# Patient Record
Sex: Female | Born: 1955 | Race: White | Hispanic: No | Marital: Married | Smoking: Never smoker
Health system: Southern US, Community
[De-identification: ages and names within clinical notes are randomized; demographics above are authoritative.]

## PROBLEM LIST (undated history)

## (undated) DIAGNOSIS — D6942 Congenital and hereditary thrombocytopenia purpura: Secondary | ICD-10-CM

## (undated) DIAGNOSIS — T7840XA Allergy, unspecified, initial encounter: Secondary | ICD-10-CM

## (undated) DIAGNOSIS — Z860101 Personal history of adenomatous and serrated colon polyps: Secondary | ICD-10-CM

## (undated) DIAGNOSIS — M270 Developmental disorders of jaws: Secondary | ICD-10-CM

## (undated) DIAGNOSIS — Z86010 Personal history of colonic polyps: Secondary | ICD-10-CM

## (undated) HISTORY — DX: Developmental disorders of jaws: M27.0

## (undated) HISTORY — DX: Congenital and hereditary thrombocytopenia purpura: D69.42

## (undated) HISTORY — DX: Personal history of adenomatous and serrated colon polyps: Z86.0101

## (undated) HISTORY — PX: ABDOMINAL HYSTERECTOMY: SHX81

## (undated) HISTORY — DX: Allergy, unspecified, initial encounter: T78.40XA

## (undated) HISTORY — DX: Personal history of colonic polyps: Z86.010

---

## 1998-09-17 HISTORY — PX: COMBINED HYSTERECTOMY ABDOMINAL W/ MMK / BURCH PROCEDURE: SUR293

## 1999-06-28 ENCOUNTER — Encounter (INDEPENDENT_AMBULATORY_CARE_PROVIDER_SITE_OTHER): Payer: Self-pay | Admitting: Specialist

## 1999-06-28 ENCOUNTER — Inpatient Hospital Stay (HOSPITAL_COMMUNITY): Admission: RE | Admit: 1999-06-28 | Discharge: 1999-06-30 | Payer: Self-pay | Admitting: *Deleted

## 2000-02-27 ENCOUNTER — Encounter: Payer: Self-pay | Admitting: *Deleted

## 2000-02-27 ENCOUNTER — Encounter: Admission: RE | Admit: 2000-02-27 | Discharge: 2000-02-27 | Payer: Self-pay | Admitting: *Deleted

## 2001-02-27 ENCOUNTER — Encounter: Admission: RE | Admit: 2001-02-27 | Discharge: 2001-02-27 | Payer: Self-pay | Admitting: *Deleted

## 2001-02-27 ENCOUNTER — Encounter: Payer: Self-pay | Admitting: *Deleted

## 2001-04-01 ENCOUNTER — Other Ambulatory Visit: Admission: RE | Admit: 2001-04-01 | Discharge: 2001-04-01 | Payer: Self-pay | Admitting: *Deleted

## 2002-04-20 ENCOUNTER — Other Ambulatory Visit: Admission: RE | Admit: 2002-04-20 | Discharge: 2002-04-20 | Payer: Self-pay | Admitting: *Deleted

## 2002-04-27 ENCOUNTER — Encounter: Admission: RE | Admit: 2002-04-27 | Discharge: 2002-04-27 | Payer: Self-pay | Admitting: *Deleted

## 2002-04-27 ENCOUNTER — Encounter: Payer: Self-pay | Admitting: *Deleted

## 2003-05-14 ENCOUNTER — Encounter: Payer: Self-pay | Admitting: *Deleted

## 2003-05-14 ENCOUNTER — Encounter: Admission: RE | Admit: 2003-05-14 | Discharge: 2003-05-14 | Payer: Self-pay | Admitting: *Deleted

## 2003-06-07 ENCOUNTER — Other Ambulatory Visit: Admission: RE | Admit: 2003-06-07 | Discharge: 2003-06-07 | Payer: Self-pay | Admitting: *Deleted

## 2004-06-14 ENCOUNTER — Encounter: Admission: RE | Admit: 2004-06-14 | Discharge: 2004-06-14 | Payer: Self-pay | Admitting: *Deleted

## 2004-09-22 ENCOUNTER — Encounter: Admission: RE | Admit: 2004-09-22 | Discharge: 2004-09-22 | Payer: Self-pay

## 2006-09-17 LAB — HM DEXA SCAN: HM Dexa Scan: NORMAL

## 2008-05-21 ENCOUNTER — Other Ambulatory Visit: Admission: RE | Admit: 2008-05-21 | Discharge: 2008-05-21 | Payer: Self-pay | Admitting: Family Medicine

## 2008-06-09 ENCOUNTER — Encounter: Admission: RE | Admit: 2008-06-09 | Discharge: 2008-06-09 | Payer: Self-pay | Admitting: Family Medicine

## 2012-01-30 ENCOUNTER — Other Ambulatory Visit (HOSPITAL_COMMUNITY)
Admission: RE | Admit: 2012-01-30 | Discharge: 2012-01-30 | Disposition: A | Discharge status: Home / Self Care | Payer: Managed Care, Other (non HMO) | Source: Ambulatory Visit | Attending: Family Medicine | Admitting: Family Medicine

## 2012-01-30 ENCOUNTER — Encounter: Payer: Self-pay | Admitting: Family Medicine

## 2012-01-30 ENCOUNTER — Ambulatory Visit (INDEPENDENT_AMBULATORY_CARE_PROVIDER_SITE_OTHER): Payer: Managed Care, Other (non HMO) | Admitting: Family Medicine

## 2012-01-30 VITALS — BP 120/84 | HR 68 | Temp 98.2°F | Ht 67.75 in | Wt 155.4 lb

## 2012-01-30 DIAGNOSIS — D6942 Congenital and hereditary thrombocytopenia purpura: Secondary | ICD-10-CM

## 2012-01-30 DIAGNOSIS — Z0000 Encounter for general adult medical examination without abnormal findings: Secondary | ICD-10-CM

## 2012-01-30 DIAGNOSIS — Z01419 Encounter for gynecological examination (general) (routine) without abnormal findings: Secondary | ICD-10-CM | POA: Insufficient documentation

## 2012-01-30 HISTORY — DX: Congenital and hereditary thrombocytopenia purpura: D69.42

## 2012-01-30 LAB — CBC WITH DIFFERENTIAL/PLATELET
Basophils Absolute: 0 10*3/uL (ref 0.0–0.1)
Eosinophils Absolute: 0 10*3/uL (ref 0.0–0.7)
Lymphocytes Relative: 22.4 % (ref 12.0–46.0)
MCHC: 33.3 g/dL (ref 30.0–36.0)
Neutrophils Relative %: 69.3 % (ref 43.0–77.0)
Platelets: 97 10*3/uL — ABNORMAL LOW (ref 150.0–400.0)
RDW: 13.3 % (ref 11.5–14.6)

## 2012-01-30 LAB — BASIC METABOLIC PANEL
CO2: 26 mEq/L (ref 19–32)
Calcium: 9.3 mg/dL (ref 8.4–10.5)
Creatinine, Ser: 0.9 mg/dL (ref 0.4–1.2)
Glucose, Bld: 83 mg/dL (ref 70–99)

## 2012-01-30 LAB — HEPATIC FUNCTION PANEL
Alkaline Phosphatase: 62 U/L (ref 39–117)
Bilirubin, Direct: 0 mg/dL (ref 0.0–0.3)

## 2012-01-30 LAB — LIPID PANEL
HDL: 76.5 mg/dL (ref >39.00)
Total CHOL/HDL Ratio: 2
VLDL: 8.2 mg/dL (ref 0.0–40.0)

## 2012-01-30 LAB — TSH: TSH: 1 u[IU]/mL (ref 0.35–5.50)

## 2012-01-30 MED ORDER — ALPRAZOLAM 0.5 MG PO TABS: ORAL_TABLET | ORAL | Status: DC

## 2012-01-30 NOTE — Progress Notes (Signed)
Addended by: Lelon Perla on: 01/30/2012 10:25 AM   Modules accepted: Orders

## 2012-01-30 NOTE — Patient Instructions (Signed)
Preventive Care for Adults, Female A healthy lifestyle and preventive care can promote health and wellness. Preventive health guidelines for women include the following key practices.  A routine yearly physical is a good way to check with your caregiver about your health and preventive screening. It is a chance to share any concerns and updates on your health, and to receive a thorough exam.   Visit your dentist for a routine exam and preventive care every 6 months. Brush your teeth twice a day and floss once a day. Good oral hygiene prevents tooth decay and gum disease.   The frequency of eye exams is based on your age, health, family medical history, use of contact lenses, and other factors. Follow your caregiver's recommendations for frequency of eye exams.   Eat a healthy diet. Foods like vegetables, fruits, whole grains, low-fat dairy products, and lean protein foods contain the nutrients you need without too many calories. Decrease your intake of foods high in solid fats, added sugars, and salt. Eat the right amount of calories for you.Get information about a proper diet from your caregiver, if necessary.   Regular physical exercise is one of the most important things you can do for your health. Most adults should get at least 150 minutes of moderate-intensity exercise (any activity that increases your heart rate and causes you to sweat) each week. In addition, most adults need muscle-strengthening exercises on 2 or more days a week.   Maintain a healthy weight. The body mass index (BMI) is a screening tool to identify possible weight problems. It provides an estimate of body fat based on height and weight. Your caregiver can help determine your BMI, and can help you achieve or maintain a healthy weight.For adults 20 years and older:   A BMI below 18.5 is considered underweight.   A BMI of 18.5 to 24.9 is normal.   A BMI of 25 to 29.9 is considered overweight.   A BMI of 30 and above is  considered obese.   Maintain normal blood lipids and cholesterol levels by exercising and minimizing your intake of saturated fat. Eat a balanced diet with plenty of fruit and vegetables. Blood tests for lipids and cholesterol should begin at age 20 and be repeated every 5 years. If your lipid or cholesterol levels are high, you are over 50, or you are at high risk for heart disease, you may need your cholesterol levels checked more frequently.Ongoing high lipid and cholesterol levels should be treated with medicines if diet and exercise are not effective.   If you smoke, find out from your caregiver how to quit. If you do not use tobacco, do not start.   If you are pregnant, do not drink alcohol. If you are breastfeeding, be very cautious about drinking alcohol. If you are not pregnant and choose to drink alcohol, do not exceed 1 drink per day. One drink is considered to be 12 ounces (355 mL) of beer, 5 ounces (148 mL) of wine, or 1.5 ounces (44 mL) of liquor.   Avoid use of street drugs. Do not share needles with anyone. Ask for help if you need support or instructions about stopping the use of drugs.   High blood pressure causes heart disease and increases the risk of stroke. Your blood pressure should be checked at least every 1 to 2 years. Ongoing high blood pressure should be treated with medicines if weight loss and exercise are not effective.   If you are 55 to 57   years old, ask your caregiver if you should take aspirin to prevent strokes.   Diabetes screening involves taking a blood sample to check your fasting blood sugar level. This should be done once every 3 years, after age 45, if you are within normal weight and without risk factors for diabetes. Testing should be considered at a younger age or be carried out more frequently if you are overweight and have at least 1 risk factor for diabetes.   Breast cancer screening is essential preventive care for women. You should practice "breast  self-awareness." This means understanding the normal appearance and feel of your breasts and may include breast self-examination. Any changes detected, no matter how small, should be reported to a caregiver. Women in their 20s and 30s should have a clinical breast exam (CBE) by a caregiver as part of a regular health exam every 1 to 3 years. After age 40, women should have a CBE every year. Starting at age 40, women should consider having a mammography (breast X-ray test) every year. Women who have a family history of breast cancer should talk to their caregiver about genetic screening. Women at a high risk of breast cancer should talk to their caregivers about having magnetic resonance imaging (MRI) and a mammography every year.   The Pap test is a screening test for cervical cancer. A Pap test can show cell changes on the cervix that might become cervical cancer if left untreated. A Pap test is a procedure in which cells are obtained and examined from the lower end of the uterus (cervix).   Women should have a Pap test starting at age 21.   Between ages 21 and 29, Pap tests should be repeated every 2 years.   Beginning at age 30, you should have a Pap test every 3 years as long as the past 3 Pap tests have been normal.   Some women have medical problems that increase the chance of getting cervical cancer. Talk to your caregiver about these problems. It is especially important to talk to your caregiver if a new problem develops soon after your last Pap test. In these cases, your caregiver may recommend more frequent screening and Pap tests.   The above recommendations are the same for women who have or have not gotten the vaccine for human papillomavirus (HPV).   If you had a hysterectomy for a problem that was not cancer or a condition that could lead to cancer, then you no longer need Pap tests. Even if you no longer need a Pap test, a regular exam is a good idea to make sure no other problems are  starting.   If you are between ages 65 and 70, and you have had normal Pap tests going back 10 years, you no longer need Pap tests. Even if you no longer need a Pap test, a regular exam is a good idea to make sure no other problems are starting.   If you have had past treatment for cervical cancer or a condition that could lead to cancer, you need Pap tests and screening for cancer for at least 20 years after your treatment.   If Pap tests have been discontinued, risk factors (such as a new sexual partner) need to be reassessed to determine if screening should be resumed.   The HPV test is an additional test that may be used for cervical cancer screening. The HPV test looks for the virus that can cause the cell changes on the cervix.   The cells collected during the Pap test can be tested for HPV. The HPV test could be used to screen women aged 30 years and older, and should be used in women of any age who have unclear Pap test results. After the age of 30, women should have HPV testing at the same frequency as a Pap test.   Colorectal cancer can be detected and often prevented. Most routine colorectal cancer screening begins at the age of 50 and continues through age 75. However, your caregiver may recommend screening at an earlier age if you have risk factors for colon cancer. On a yearly basis, your caregiver may provide home test kits to check for hidden blood in the stool. Use of a small camera at the end of a tube, to directly examine the colon (sigmoidoscopy or colonoscopy), can detect the earliest forms of colorectal cancer. Talk to your caregiver about this at age 50, when routine screening begins. Direct examination of the colon should be repeated every 5 to 10 years through age 75, unless early forms of pre-cancerous polyps or small growths are found.   Hepatitis C blood testing is recommended for all people born from 1945 through 1965 and any individual with known risks for hepatitis C.    Practice safe sex. Use condoms and avoid high-risk sexual practices to reduce the spread of sexually transmitted infections (STIs). STIs include gonorrhea, chlamydia, syphilis, trichomonas, herpes, HPV, and human immunodeficiency virus (HIV). Herpes, HIV, and HPV are viral illnesses that have no cure. They can result in disability, cancer, and death. Sexually active women aged 25 and younger should be checked for chlamydia. Older women with new or multiple partners should also be tested for chlamydia. Testing for other STIs is recommended if you are sexually active and at increased risk.   Osteoporosis is a disease in which the bones lose minerals and strength with aging. This can result in serious bone fractures. The risk of osteoporosis can be identified using a bone density scan. Women ages 65 and over and women at risk for fractures or osteoporosis should discuss screening with their caregivers. Ask your caregiver whether you should take a calcium supplement or vitamin D to reduce the rate of osteoporosis.   Menopause can be associated with physical symptoms and risks. Hormone replacement therapy is available to decrease symptoms and risks. You should talk to your caregiver about whether hormone replacement therapy is right for you.   Use sunscreen with sun protection factor (SPF) of 30 or more. Apply sunscreen liberally and repeatedly throughout the day. You should seek shade when your shadow is shorter than you. Protect yourself by wearing long sleeves, pants, a wide-brimmed hat, and sunglasses year round, whenever you are outdoors.   Once a month, do a whole body skin exam, using a mirror to look at the skin on your back. Notify your caregiver of new moles, moles that have irregular borders, moles that are larger than a pencil eraser, or moles that have changed in shape or color.   Stay current with required immunizations.   Influenza. You need a dose every fall (or winter). The composition of  the flu vaccine changes each year, so being vaccinated once is not enough.   Pneumococcal polysaccharide. You need 1 to 2 doses if you smoke cigarettes or if you have certain chronic medical conditions. You need 1 dose at age 65 (or older) if you have never been vaccinated.   Tetanus, diphtheria, pertussis (Tdap, Td). Get 1 dose of   Tdap vaccine if you are younger than age 65, are over 65 and have contact with an infant, are a healthcare worker, are pregnant, or simply want to be protected from whooping cough. After that, you need a Td booster dose every 10 years. Consult your caregiver if you have not had at least 3 tetanus and diphtheria-containing shots sometime in your life or have a deep or dirty wound.   HPV. You need this vaccine if you are a woman age 26 or younger. The vaccine is given in 3 doses over 6 months.   Measles, mumps, rubella (MMR). You need at least 1 dose of MMR if you were born in 1957 or later. You may also need a second dose.   Meningococcal. If you are age 19 to 21 and a first-year college student living in a residence hall, or have one of several medical conditions, you need to get vaccinated against meningococcal disease. You may also need additional booster doses.   Zoster (shingles). If you are age 60 or older, you should get this vaccine.   Varicella (chickenpox). If you have never had chickenpox or you were vaccinated but received only 1 dose, talk to your caregiver to find out if you need this vaccine.   Hepatitis A. You need this vaccine if you have a specific risk factor for hepatitis A virus infection or you simply wish to be protected from this disease. The vaccine is usually given as 2 doses, 6 to 18 months apart.   Hepatitis B. You need this vaccine if you have a specific risk factor for hepatitis B virus infection or you simply wish to be protected from this disease. The vaccine is given in 3 doses, usually over 6 months.  Preventive Services /  Frequency Ages 19 to 39  Blood pressure check.** / Every 1 to 2 years.   Lipid and cholesterol check.** / Every 5 years beginning at age 20.   Clinical breast exam.** / Every 3 years for women in their 20s and 30s.   Pap test.** / Every 2 years from ages 21 through 29. Every 3 years starting at age 30 through age 65 or 70 with a history of 3 consecutive normal Pap tests.   HPV screening.** / Every 3 years from ages 30 through ages 65 to 70 with a history of 3 consecutive normal Pap tests.   Hepatitis C blood test.** / For any individual with known risks for hepatitis C.   Skin self-exam. / Monthly.   Influenza immunization.** / Every year.   Pneumococcal polysaccharide immunization.** / 1 to 2 doses if you smoke cigarettes or if you have certain chronic medical conditions.   Tetanus, diphtheria, pertussis (Tdap, Td) immunization. / A one-time dose of Tdap vaccine. After that, you need a Td booster dose every 10 years.   HPV immunization. / 3 doses over 6 months, if you are 26 and younger.   Measles, mumps, rubella (MMR) immunization. / You need at least 1 dose of MMR if you were born in 1957 or later. You may also need a second dose.   Meningococcal immunization. / 1 dose if you are age 19 to 21 and a first-year college student living in a residence hall, or have one of several medical conditions, you need to get vaccinated against meningococcal disease. You may also need additional booster doses.   Varicella immunization.** / Consult your caregiver.   Hepatitis A immunization.** / Consult your caregiver. 2 doses, 6 to 18 months   apart.   Hepatitis B immunization.** / Consult your caregiver. 3 doses usually over 6 months.  Ages 40 to 64  Blood pressure check.** / Every 1 to 2 years.   Lipid and cholesterol check.** / Every 5 years beginning at age 20.   Clinical breast exam.** / Every year after age 40.   Mammogram.** / Every year beginning at age 40 and continuing for as  long as you are in good health. Consult with your caregiver.   Pap test.** / Every 3 years starting at age 30 through age 65 or 70 with a history of 3 consecutive normal Pap tests.   HPV screening.** / Every 3 years from ages 30 through ages 65 to 70 with a history of 3 consecutive normal Pap tests.   Fecal occult blood test (FOBT) of stool. / Every year beginning at age 50 and continuing until age 75. You may not need to do this test if you get a colonoscopy every 10 years.   Flexible sigmoidoscopy or colonoscopy.** / Every 5 years for a flexible sigmoidoscopy or every 10 years for a colonoscopy beginning at age 50 and continuing until age 75.   Hepatitis C blood test.** / For all people born from 1945 through 1965 and any individual with known risks for hepatitis C.   Skin self-exam. / Monthly.   Influenza immunization.** / Every year.   Pneumococcal polysaccharide immunization.** / 1 to 2 doses if you smoke cigarettes or if you have certain chronic medical conditions.   Tetanus, diphtheria, pertussis (Tdap, Td) immunization.** / A one-time dose of Tdap vaccine. After that, you need a Td booster dose every 10 years.   Measles, mumps, rubella (MMR) immunization. / You need at least 1 dose of MMR if you were born in 1957 or later. You may also need a second dose.   Varicella immunization.** / Consult your caregiver.   Meningococcal immunization.** / Consult your caregiver.   Hepatitis A immunization.** / Consult your caregiver. 2 doses, 6 to 18 months apart.   Hepatitis B immunization.** / Consult your caregiver. 3 doses, usually over 6 months.  Ages 65 and over  Blood pressure check.** / Every 1 to 2 years.   Lipid and cholesterol check.** / Every 5 years beginning at age 20.   Clinical breast exam.** / Every year after age 40.   Mammogram.** / Every year beginning at age 40 and continuing for as long as you are in good health. Consult with your caregiver.   Pap test.** /  Every 3 years starting at age 30 through age 65 or 70 with a 3 consecutive normal Pap tests. Testing can be stopped between 65 and 70 with 3 consecutive normal Pap tests and no abnormal Pap or HPV tests in the past 10 years.   HPV screening.** / Every 3 years from ages 30 through ages 65 or 70 with a history of 3 consecutive normal Pap tests. Testing can be stopped between 65 and 70 with 3 consecutive normal Pap tests and no abnormal Pap or HPV tests in the past 10 years.   Fecal occult blood test (FOBT) of stool. / Every year beginning at age 50 and continuing until age 75. You may not need to do this test if you get a colonoscopy every 10 years.   Flexible sigmoidoscopy or colonoscopy.** / Every 5 years for a flexible sigmoidoscopy or every 10 years for a colonoscopy beginning at age 50 and continuing until age 75.   Hepatitis   C blood test.** / For all people born from 1945 through 1965 and any individual with known risks for hepatitis C.   Osteoporosis screening.** / A one-time screening for women ages 65 and over and women at risk for fractures or osteoporosis.   Skin self-exam. / Monthly.   Influenza immunization.** / Every year.   Pneumococcal polysaccharide immunization.** / 1 dose at age 65 (or older) if you have never been vaccinated.   Tetanus, diphtheria, pertussis (Tdap, Td) immunization. / A one-time dose of Tdap vaccine if you are over 65 and have contact with an infant, are a healthcare worker, or simply want to be protected from whooping cough. After that, you need a Td booster dose every 10 years.   Varicella immunization.** / Consult your caregiver.   Meningococcal immunization.** / Consult your caregiver.   Hepatitis A immunization.** / Consult your caregiver. 2 doses, 6 to 18 months apart.   Hepatitis B immunization.** / Check with your caregiver. 3 doses, usually over 6 months.  ** Family history and personal history of risk and conditions may change your caregiver's  recommendations. Document Released: 10/30/2001 Document Revised: 08/23/2011 Document Reviewed: 01/29/2011 [employer] Patient Information 2012 [employer]. 

## 2012-01-30 NOTE — Progress Notes (Signed)
Subjective:     Sara Martinez is a 56 y.o. female and is here for a comprehensive physical exam. The patient reports no problems.  History   Social History  . Marital Status: Married    Spouse Name: N/A    Number of Children: 1  . Years of Education: N/A   Occupational History  . nurse-- [employer]   Social History Main Topics  . Smoking status: Never Smoker   . Smokeless tobacco: Never Used  . Alcohol Use: 1.2 oz/week    2 Glasses of wine per week  . Drug Use: No  . Sexually Active: Yes -- Female partner(s)   Other Topics Concern  . Not on file   Social History Narrative   Exercise-- no   Health Maintenance  Topic Date Due  . Pap Smear  12/08/1972  . Tetanus/tdap  12/08/1973  . Colonoscopy  12/08/2004  . Mammogram  06/09/2010  . Influenza Vaccine  06/17/2012    The following portions of the patient's history were reviewed and updated as appropriate: allergies, current medications, past family history, past medical history, past social history, past surgical history and problem list.  Review of Systems Review of Systems  Constitutional: Negative for activity change, appetite change and fatigue.  HENT: Negative for hearing loss, congestion, tinnitus and ear discharge.  dentist q25m Eyes: Negative for visual disturbance (see optho q1y -- vision corrected to 20/20 with glasses).  Respiratory: Negative for cough, chest tightness and shortness of breath.   Cardiovascular: Negative for chest pain, palpitations and leg swelling.  Gastrointestinal: Negative for abdominal pain, diarrhea, constipation and abdominal distention.  Genitourinary: Negative for urgency, frequency, decreased urine volume and difficulty urinating.  Musculoskeletal: Negative for back pain, arthralgias and gait problem.  Skin: Negative for color change, pallor and rash.  Neurological: Negative for dizziness, light-headedness, numbness and headaches.  Hematological: Negative for adenopathy. Does  not bruise/bleed easily.  Psychiatric/Behavioral: Negative for suicidal ideas, confusion, sleep disturbance, self-injury, dysphoric mood, decreased concentration and agitation.       Objective:    BP 120/84  Pulse 68  Temp(Src) 98.2 F (36.8 C) (Oral)  Ht 5' 7.75" (1.721 m)  Wt 155 lb 6.4 oz (70.489 kg)  BMI 23.80 kg/m2  SpO2 99% General appearance: alert, cooperative, appears stated age and no distress Head: Normocephalic, without obvious abnormality, atraumatic Eyes: conjunctivae/corneas clear. PERRL, EOM's intact. Fundi benign. Ears: normal TM's and external ear canals both ears Nose: Nares normal. Septum midline. Mucosa normal. No drainage or sinus tenderness. Throat: lips, mucosa, and tongue normal; teeth and gums normal Neck: no adenopathy, no carotid bruit, no JVD, supple, symmetrical, trachea midline and thyroid not enlarged, symmetric, no tenderness/mass/nodules Back: symmetric, no curvature. ROM normal. No CVA tenderness. Lungs: clear to auscultation bilaterally Breasts: normal appearance, no masses or tenderness Heart: regular rate and rhythm, S1, S2 normal, no murmur, click, rub or gallop Abdomen: soft, non-tender; bowel sounds normal; no masses,  no organomegaly Pelvic: external genitalia normal, no adnexal masses or tenderness, uterus surgically absent and vagina normal without discharge--vaginal smear done Extremities: extremities normal, atraumatic, no cyanosis or edema Pulses: 2+ and symmetric Skin: Skin color, texture, turgor normal. No rashes or lesions Lymph nodes: Cervical, supraclavicular, and axillary nodes normal. Neurologic: Alert and oriented X 3, normal strength and tone. Normal symmetric reflexes. Normal coordination and gait psych-- no depression or anxiety    Assessment:    Healthy female exam.      Plan:    check  labs Check colon Pt will schedule mammo and bmd  See After Visit Summary for Counseling Recommendations

## 2012-01-31 ENCOUNTER — Encounter: Payer: Self-pay | Admitting: Internal Medicine

## 2012-02-06 ENCOUNTER — Telehealth: Payer: Self-pay | Admitting: Family Medicine

## 2012-02-06 ENCOUNTER — Other Ambulatory Visit: Payer: Self-pay | Admitting: Family Medicine

## 2012-02-06 DIAGNOSIS — M8580 Other specified disorders of bone density and structure, unspecified site: Secondary | ICD-10-CM

## 2012-02-06 DIAGNOSIS — Z780 Asymptomatic menopausal state: Secondary | ICD-10-CM

## 2012-02-06 DIAGNOSIS — Z1231 Encounter for screening mammogram for malignant neoplasm of breast: Secondary | ICD-10-CM

## 2012-02-06 NOTE — Telephone Encounter (Signed)
Please advise      KP 

## 2012-03-12 ENCOUNTER — Encounter: Payer: Managed Care, Other (non HMO) | Admitting: Internal Medicine

## 2012-03-14 ENCOUNTER — Ambulatory Visit (AMBULATORY_SURGERY_CENTER): Payer: Managed Care, Other (non HMO) | Admitting: *Deleted

## 2012-03-14 ENCOUNTER — Encounter: Payer: Self-pay | Admitting: Internal Medicine

## 2012-03-14 VITALS — Ht 67.5 in | Wt 154.4 lb

## 2012-03-14 DIAGNOSIS — Z1211 Encounter for screening for malignant neoplasm of colon: Secondary | ICD-10-CM

## 2012-03-14 MED ORDER — MOVIPREP 100 G PO SOLR: ORAL | Status: DC

## 2012-03-14 NOTE — Progress Notes (Signed)
No allergies to eggs or soybeans 

## 2012-03-26 ENCOUNTER — Ambulatory Visit
Admission: RE | Admit: 2012-03-26 | Discharge: 2012-03-26 | Disposition: A | Discharge status: Home / Self Care | Payer: Managed Care, Other (non HMO) | Source: Ambulatory Visit | Attending: Family Medicine | Admitting: Family Medicine

## 2012-03-26 ENCOUNTER — Other Ambulatory Visit: Payer: Managed Care, Other (non HMO)

## 2012-03-26 ENCOUNTER — Ambulatory Visit: Payer: Managed Care, Other (non HMO)

## 2012-03-26 DIAGNOSIS — Z780 Asymptomatic menopausal state: Secondary | ICD-10-CM

## 2012-03-26 DIAGNOSIS — M8580 Other specified disorders of bone density and structure, unspecified site: Secondary | ICD-10-CM

## 2012-03-26 DIAGNOSIS — Z1231 Encounter for screening mammogram for malignant neoplasm of breast: Secondary | ICD-10-CM

## 2012-03-28 ENCOUNTER — Ambulatory Visit (AMBULATORY_SURGERY_CENTER): Payer: Managed Care, Other (non HMO) | Admitting: Internal Medicine

## 2012-03-28 ENCOUNTER — Encounter: Payer: Self-pay | Admitting: Internal Medicine

## 2012-03-28 VITALS — BP 157/80 | HR 65 | Temp 98.3°F | Resp 16 | Ht 67.0 in | Wt 154.0 lb

## 2012-03-28 DIAGNOSIS — D126 Benign neoplasm of colon, unspecified: Secondary | ICD-10-CM

## 2012-03-28 DIAGNOSIS — Z1211 Encounter for screening for malignant neoplasm of colon: Secondary | ICD-10-CM

## 2012-03-28 MED ORDER — SODIUM CHLORIDE 0.9 % IV SOLN: 500.0000 mL | INTRAVENOUS | Status: DC

## 2012-03-28 NOTE — Patient Instructions (Addendum)
Two polyps were removed today. I suspect they are benign and will let you know with a letter. The colon was otherwise normal.  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  Iva Boop, MD, FACG YOU HAD AN ENDOSCOPIC PROCEDURE TODAY AT THE [facility]: Refer to the procedure report that was given to you for any specific questions about what was found during the examination.  If the procedure report does not answer your questions, please call your gastroenterologist to clarify.  If you requested that your care partner not be given the details of your procedure findings, then the procedure report has been included in a sealed envelope for you to review at your convenience later.  YOU SHOULD EXPECT: Some feelings of bloating in the abdomen. Passage of more gas than usual.  Walking can help get rid of the air that was put into your GI tract during the procedure and reduce the bloating. If you had a lower endoscopy (such as a colonoscopy or flexible sigmoidoscopy) you may notice spotting of blood in your stool or on the toilet paper. If you underwent a bowel prep for your procedure, then you may not have a normal bowel movement for a few days.  DIET: Your first meal following the procedure should be a light meal and then it is ok to progress to your normal diet.  A half-sandwich or bowl of soup is an example of a good first meal.  Heavy or fried foods are harder to digest and may make you feel nauseous or bloated.  Likewise meals heavy in dairy and vegetables can cause extra gas to form and this can also increase the bloating.  Drink plenty of fluids but you should avoid alcoholic beverages for 24 hours.  ACTIVITY: Your care partner should take you home directly after the procedure.  You should plan to take it easy, moving slowly for the rest of the day.  You can resume normal activity the day after the  procedure however you should NOT DRIVE or use heavy machinery for 24 hours (because of the sedation medicines used during the test).    SYMPTOMS TO REPORT IMMEDIATELY: A gastroenterologist can be reached at any hour.  During normal business hours, 8:30 AM to 5:00 PM Monday through Friday, call [phone].  After hours and on weekends, please call the GI answering service at [phone] who will take a message and have the physician on call contact you.   Following lower endoscopy (colonoscopy or flexible sigmoidoscopy):  Excessive amounts of blood in the stool  Significant tenderness or worsening of abdominal pains  Swelling of the abdomen that is new, acute  Fever of 100F or higher  FOLLOW UP: If any biopsies were taken you will be contacted by phone or by letter within the next 1-3 weeks.  Call your gastroenterologist if you have not heard about the biopsies in 3 weeks.  Our staff will call the home number listed on your records the next business day following your procedure to check on you and address any questions or concerns that you may have at that time regarding the information given to you following your procedure. This is a courtesy call and so if there is no answer at the home number and we have not heard from you through the emergency physician on call, we will assume that you have returned to your regular daily activities without incident.  SIGNATURES/CONFIDENTIALITY: You and/or your care partner have signed paperwork which will be entered into your electronic medical record.  These signatures attest to the fact that that the information above on your After Visit Summary has been reviewed and is understood.  Full responsibility of the confidentiality of this discharge information lies with you and/or your care-partner.

## 2012-03-28 NOTE — Progress Notes (Signed)
Patient did not have preoperative order for IV antibiotic SSI prophylaxis. (G8918)  Patient did not experience any of the following events: a burn prior to discharge; a fall within the facility; wrong site/side/patient/procedure/implant event; or a hospital transfer or hospital admission upon discharge from the facility. (G8907)  

## 2012-03-28 NOTE — Op Note (Signed)
[facility] [address] [place], [place]  [address]  COLONOSCOPY PROCEDURE REPORT  PATIENT:  Sara Martinez, Sara Martinez  MR#:  [id number] BIRTHDATE:  10/22/1955, 56 yrs. old  GENDER:  female ENDOSCOPIST:  Iva Boop, MD, [facility] REF. BY:  Loreen Freud, DO PROCEDURE DATE:  03/28/2012 PROCEDURE:  Colonoscopy with snare polypectomy ASA CLASS:  Class II INDICATIONS:  Routine Risk Screening MEDICATIONS:   These medications were titrated to patient response per physician's verbal order, MAC sedation, administered by CRNA, propofol (Diprivan) 270 mg IV  DESCRIPTION OF PROCEDURE:   After the risks benefits and alternatives of the procedure were thoroughly explained, informed consent was obtained.  Digital rectal exam was performed and revealed no abnormalities.   The LB CF-H180AL [id number] endoscope was introduced through the anus and advanced to the cecum, which was identified by both the appendix and ileocecal valve, without limitations.  The quality of the prep was excellent, using MoviPrep.  The instrument was then slowly withdrawn as the colon was fully examined. <<PROCEDUREIMAGES>>  FINDINGS:  A sessile polyp was found at the splenic flexure. It was 15 mm in size. Polyp was snared, then cauterized with monopolar cautery. Retrieval was successful. snare polyp Piecemeal.  A diminutive polyp was found in the descending colon. It was 4 mm in size. Polyp was snared without cautery. Retrieval was successful. snare polyp  This was otherwise a normal examination of the colon. Includes right colon retroflexion. Retroflexed views in the rectum revealed no abnormalities.    The time to cecum = 8:12 minutes. The scope was then withdrawn in 8:39 minutes from the cecum and the procedure completed. COMPLICATIONS:  None ENDOSCOPIC IMPRESSION: 1) 15 mm sessile polyp at the splenic flexure 2) 4 mm diminutive polyp in the descending colon 3) Otherwise normal examination RECOMMENDATIONS: 1)  Hold aspirin, aspirin products, and anti-inflammatory medication for 2 weeks. REPEAT EXAM:  In for Colonoscopy, pending biopsy results.  Iva Boop, MD, [facility]  CC:  Lelon Perla, DO and The Patient  n. eSIGNED:   Iva Boop at 03/28/2012 10:13 AM  Mertha Baars, [phone]

## 2012-03-31 ENCOUNTER — Telehealth: Payer: Self-pay | Admitting: *Deleted

## 2012-03-31 NOTE — Telephone Encounter (Signed)
  Follow up Call-  Call back number 03/28/2012  Post procedure Call Back phone  # [phone]  Permission to leave phone message Yes     Patient questions:  Left message to call us if necessary.

## 2012-04-03 ENCOUNTER — Encounter: Payer: Self-pay | Admitting: Internal Medicine

## 2012-04-03 DIAGNOSIS — Z86010 Personal history of colonic polyps: Secondary | ICD-10-CM | POA: Insufficient documentation

## 2012-04-03 NOTE — Progress Notes (Signed)
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

## 2012-04-11 ENCOUNTER — Encounter: Payer: Self-pay | Admitting: Family Medicine

## 2014-09-17 HISTORY — PX: COLONOSCOPY: SHX174

## 2015-05-24 ENCOUNTER — Encounter: Payer: Self-pay | Admitting: Internal Medicine

## 2015-05-31 ENCOUNTER — Encounter: Payer: Self-pay | Admitting: Internal Medicine

## 2015-06-03 ENCOUNTER — Ambulatory Visit (AMBULATORY_SURGERY_CENTER): Payer: Self-pay

## 2015-06-03 VITALS — Ht 67.0 in | Wt 183.0 lb

## 2015-06-03 DIAGNOSIS — Z860100 Personal history of colon polyps, unspecified: Secondary | ICD-10-CM

## 2015-06-03 NOTE — Progress Notes (Signed)
No allergies to eggs or soy No diet/weight loss meds No home oxygen No past problems with anesthesia  Has email  Emmi instructions given for colonoscopy 

## 2015-06-17 ENCOUNTER — Encounter: Payer: Self-pay | Admitting: Internal Medicine

## 2015-06-17 ENCOUNTER — Ambulatory Visit (AMBULATORY_SURGERY_CENTER): Payer: Managed Care, Other (non HMO) | Admitting: Internal Medicine

## 2015-06-17 VITALS — BP 142/83 | HR 58 | Temp 97.1°F | Resp 36 | Ht 67.0 in | Wt 183.0 lb

## 2015-06-17 DIAGNOSIS — K635 Polyp of colon: Secondary | ICD-10-CM

## 2015-06-17 DIAGNOSIS — K621 Rectal polyp: Secondary | ICD-10-CM

## 2015-06-17 DIAGNOSIS — Z86010 Personal history of colonic polyps: Secondary | ICD-10-CM | POA: Diagnosis not present

## 2015-06-17 DIAGNOSIS — D123 Benign neoplasm of transverse colon: Secondary | ICD-10-CM

## 2015-06-17 DIAGNOSIS — D122 Benign neoplasm of ascending colon: Secondary | ICD-10-CM

## 2015-06-17 DIAGNOSIS — D128 Benign neoplasm of rectum: Secondary | ICD-10-CM

## 2015-06-17 DIAGNOSIS — D125 Benign neoplasm of sigmoid colon: Secondary | ICD-10-CM

## 2015-06-17 MED ORDER — SODIUM CHLORIDE 0.9 % IV SOLN: 500.0000 mL | INTRAVENOUS | Status: DC

## 2015-06-17 NOTE — Patient Instructions (Addendum)
I found and removed 4 polyps today. I will let you know pathology results and when to have another routine colonoscopy by mail.  I appreciate the opportunity to care for you. Gatha Mayer, MD, FACG  YOU HAD AN ENDOSCOPIC PROCEDURE TODAY AT [facility]:   Refer to the procedure report that was given to you for any specific questions about what was found during the examination.  If the procedure report does not answer your questions, please call your gastroenterologist to clarify.  If you requested that your care partner not be given the details of your procedure findings, then the procedure report has been included in a sealed envelope for you to review at your convenience later.  YOU SHOULD EXPECT: Some feelings of bloating in the abdomen. Passage of more gas than usual.  Walking can help get rid of the air that was put into your GI tract during the procedure and reduce the bloating. If you had a lower endoscopy (such as a colonoscopy or flexible sigmoidoscopy) you may notice spotting of blood in your stool or on the toilet paper. If you underwent a bowel prep for your procedure, you may not have a normal bowel movement for a few days.  Please Note:  You might notice some irritation and congestion in your nose or some drainage.  This is from the oxygen used during your procedure.  There is no need for concern and it should clear up in a day or so.  SYMPTOMS TO REPORT IMMEDIATELY:   Following lower endoscopy (colonoscopy or flexible sigmoidoscopy):  Excessive amounts of blood in the stool  Significant tenderness or worsening of abdominal pains  Swelling of the abdomen that is new, acute  Fever of 100F or higher   Following upper endoscopy (EGD)  Vomiting of blood or coffee ground material  New chest pain or pain under the shoulder blades  Painful or persistently difficult swallowing  New shortness of breath  Fever of 100F or higher  Black, tarry-looking  stools  For urgent or emergent issues, a gastroenterologist can be reached at any hour by calling [phone].   DIET: Your first meal following the procedure should be a small meal and then it is ok to progress to your normal diet. Heavy or fried foods are harder to digest and may make you feel nauseous or bloated.  Likewise, meals heavy in dairy and vegetables can increase bloating.  Drink plenty of fluids but you should avoid alcoholic beverages for 24 hours.  ACTIVITY:  You should plan to take it easy for the rest of today and you should NOT DRIVE or use heavy machinery until tomorrow (because of the sedation medicines used during the test).    FOLLOW UP: Our staff will call the number listed on your records the next business day following your procedure to check on you and address any questions or concerns that you may have regarding the information given to you following your procedure. If we do not reach you, we will leave a message.  However, if you are feeling well and you are not experiencing any problems, there is no need to return our call.  We will assume that you have returned to your regular daily activities without incident.  If any biopsies were taken you will be contacted by phone or by letter within the next 1-3 weeks.  Please call us at [phone] if you have not heard about the biopsies in 3 weeks.  SIGNATURES/CONFIDENTIALITY: You and/or your care partner have signed paperwork which will be entered into your electronic medical record.  These signatures attest to the fact that that the information above on your After Visit Summary has been reviewed and is understood.  Full responsibility of the confidentiality of this discharge information lies with you and/or your care-partner.  Polyp intormation given.

## 2015-06-17 NOTE — Op Note (Signed)
[facility]  [address] [place], [id number]   COLONOSCOPY PROCEDURE REPORT  PATIENT: Sara, Martinez  MR#: [id number] BIRTHDATE: March 16, 1956 , 59  yrs. old GENDER: female ENDOSCOPIST: Gatha Mayer, MD, [facility] PROCEDURE DATE:  06/17/2015 PROCEDURE:   Colonoscopy, surveillance and Colonoscopy with snare polypectomy First Screening Colonoscopy - Avg.  risk and is 50 yrs.  old or older - No.  Prior Negative Screening - Now for repeat screening. N/A  History of Adenoma - Now for follow-up colonoscopy & has been > or = to 3 yrs.  Yes hx of adenoma.  Has been 3 or more years since last colonoscopy.  Polyps removed today? Yes ASA CLASS:   Class II INDICATIONS:Surveillance due to prior colonic neoplasia and PH Colon Adenoma. MEDICATIONS: Propofol 300 mg IV and Monitored anesthesia care  DESCRIPTION OF PROCEDURE:   After the risks benefits and alternatives of the procedure were thoroughly explained, informed consent was obtained.  The digital rectal exam revealed no abnormalities of the rectum.   The LB PFC-H190 [id number]  endoscope was introduced through the anus and advanced to the cecum, which was identified by both the appendix and ileocecal valve. No adverse events experienced.   The quality of the prep was excellent. (MiraLax was used)  The instrument was then slowly withdrawn as the colon was fully examined. Estimated blood loss is zero unless otherwise noted in this procedure report.  COLON FINDINGS: Four sessile polyps ranging from 3 to 5mm in size were found in the transverse colon, ascending colon, rectum, and sigmoid colon.  Polypectomies were performed using snare cautery and with a cold snare.  The resection was complete, the polyp tissue was completely retrieved and sent to histology.   The examination was otherwise normal.  Retroflexed views revealed no abnormalities. The time to cecum = 2.8 Withdrawal time = 13.5   The scope was withdrawn and the  procedure completed. COMPLICATIONS: There were no immediate complications.  ENDOSCOPIC IMPRESSION: 1.   Four sessile polyps ranging from 3 to 57mm in size were found in the transverse colon, ascending colon, rectum, and sigmoid colon; polypectomies were performed using snare cautery and with a cold snare 2.   The examination was otherwise normal  RECOMMENDATIONS: 1.  Timing of repeat colonoscopy will be determined by pathology findings. 2.  Hold Aspirin and all other NSAIDS for 2 weeks.  eSigned:  Gatha Mayer, MD, [facility] 06/17/2015 9:09 AM   cc: The Patient

## 2015-06-17 NOTE — Progress Notes (Signed)
Called to room to assist during endoscopic procedure.  Patient ID and intended procedure confirmed with present staff. Received instructions for my participation in the procedure from the performing physician.  

## 2015-06-17 NOTE — Progress Notes (Signed)
To recovery, report to Scott, RN, VSS 

## 2015-06-20 ENCOUNTER — Telehealth: Payer: Self-pay

## 2015-06-20 NOTE — Telephone Encounter (Signed)
Left a mesaage at [phone] for the pt to call us back if any questions or concerns. maw

## 2015-06-22 ENCOUNTER — Encounter: Payer: Self-pay | Admitting: Internal Medicine

## 2015-06-22 DIAGNOSIS — Z86010 Personal history of colonic polyps: Secondary | ICD-10-CM

## 2015-06-22 NOTE — Progress Notes (Signed)
Quick Note:  2 subcentimeter adenomas and 2 distal hyperplastic polyps repeat colonoscopy 201 ______

## 2016-06-07 ENCOUNTER — Telehealth: Payer: Self-pay | Admitting: Family Medicine

## 2016-06-07 NOTE — Telephone Encounter (Signed)
Please advise and route to Sara Martinez.    KP

## 2016-06-07 NOTE — Telephone Encounter (Signed)
Relation to [id number]  Call back number:[phone]   Reason for call:  Patient would like to re establish with you, patient states her spouse is established with you he's name is Taniel, We [id number]. Please advise

## 2016-06-07 NOTE — Telephone Encounter (Signed)
ok 

## 2016-06-08 NOTE — Telephone Encounter (Signed)
Patient scheduled for 06/11/16 at 8:30am

## 2016-06-11 ENCOUNTER — Other Ambulatory Visit (HOSPITAL_COMMUNITY)
Admission: RE | Admit: 2016-06-11 | Discharge: 2016-06-11 | Disposition: A | Discharge status: Home / Self Care | Payer: Managed Care, Other (non HMO) | Source: Ambulatory Visit | Attending: Family Medicine | Admitting: Family Medicine

## 2016-06-11 ENCOUNTER — Encounter: Payer: Self-pay | Admitting: Family Medicine

## 2016-06-11 ENCOUNTER — Ambulatory Visit (INDEPENDENT_AMBULATORY_CARE_PROVIDER_SITE_OTHER): Payer: Managed Care, Other (non HMO) | Admitting: Family Medicine

## 2016-06-11 VITALS — BP 111/76 | HR 80 | Temp 98.3°F | Resp 16 | Ht 67.0 in | Wt 156.6 lb

## 2016-06-11 DIAGNOSIS — R8290 Unspecified abnormal findings in urine: Secondary | ICD-10-CM | POA: Diagnosis not present

## 2016-06-11 DIAGNOSIS — Z1239 Encounter for other screening for malignant neoplasm of breast: Secondary | ICD-10-CM | POA: Diagnosis not present

## 2016-06-11 DIAGNOSIS — Z23 Encounter for immunization: Secondary | ICD-10-CM | POA: Diagnosis not present

## 2016-06-11 DIAGNOSIS — Z0000 Encounter for general adult medical examination without abnormal findings: Secondary | ICD-10-CM | POA: Diagnosis not present

## 2016-06-11 DIAGNOSIS — Z01419 Encounter for gynecological examination (general) (routine) without abnormal findings: Secondary | ICD-10-CM | POA: Insufficient documentation

## 2016-06-11 DIAGNOSIS — Z124 Encounter for screening for malignant neoplasm of cervix: Secondary | ICD-10-CM

## 2016-06-11 LAB — CBC WITH DIFFERENTIAL/PLATELET
BASOS ABS: 0 10*3/uL (ref 0.0–0.1)
Basophils Relative: 0.3 % (ref 0.0–3.0)
EOS ABS: 0 10*3/uL (ref 0.0–0.7)
Eosinophils Relative: 0.5 % (ref 0.0–5.0)
HEMATOCRIT: 41.7 % (ref 36.0–46.0)
Hemoglobin: 14 g/dL (ref 12.0–15.0)
LYMPHS PCT: 20.2 % (ref 12.0–46.0)
Lymphs Abs: 1.6 10*3/uL (ref 0.7–4.0)
MCHC: 33.5 g/dL (ref 30.0–36.0)
MCV: 82.5 fl (ref 78.0–100.0)
Monocytes Absolute: 0.5 10*3/uL (ref 0.1–1.0)
Monocytes Relative: 6.9 % (ref 3.0–12.0)
NEUTROS ABS: 5.6 10*3/uL (ref 1.4–7.7)
NEUTROS PCT: 72.1 % (ref 43.0–77.0)
PLATELETS: 106 10*3/uL — AB (ref 150.0–400.0)
RBC: 5.05 Mil/uL (ref 3.87–5.11)
RDW: 15.4 % (ref 11.5–15.5)
WBC: 7.8 10*3/uL (ref 4.0–10.5)

## 2016-06-11 LAB — POCT URINALYSIS DIPSTICK
Bilirubin, UA: NEGATIVE
Glucose, UA: NEGATIVE
Ketones, UA: NEGATIVE
NITRITE UA: NEGATIVE
PH UA: 6
Protein, UA: NEGATIVE
RBC UA: NEGATIVE
Spec Grav, UA: 1.025
UROBILINOGEN UA: NEGATIVE

## 2016-06-11 LAB — LIPID PANEL
CHOL/HDL RATIO: 2
Cholesterol: 160 mg/dL (ref 0–200)
HDL: 66.4 mg/dL (ref >39.00)
LDL CALC: 84 mg/dL (ref 0–99)
NonHDL: 93.83
TRIGLYCERIDES: 51 mg/dL (ref 0.0–149.0)
VLDL: 10.2 mg/dL (ref 0.0–40.0)

## 2016-06-11 LAB — COMPREHENSIVE METABOLIC PANEL
ALBUMIN: 4 g/dL (ref 3.5–5.2)
ALK PHOS: 75 U/L (ref 39–117)
ALT: 17 U/L (ref 0–35)
AST: 22 U/L (ref 0–37)
BUN: 12 mg/dL (ref 6–23)
CO2: 28 mEq/L (ref 19–32)
Calcium: 9.1 mg/dL (ref 8.4–10.5)
Chloride: 102 mEq/L (ref 96–112)
Creatinine, Ser: 0.84 mg/dL (ref 0.40–1.20)
GFR: 73.14 mL/min (ref >60.00)
Glucose, Bld: 87 mg/dL (ref 70–99)
POTASSIUM: 3.9 meq/L (ref 3.5–5.1)
Sodium: 139 mEq/L (ref 135–145)
TOTAL PROTEIN: 7.1 g/dL (ref 6.0–8.3)
Total Bilirubin: 0.7 mg/dL (ref 0.2–1.2)

## 2016-06-11 LAB — TSH: TSH: 1.9 u[IU]/mL (ref 0.35–4.50)

## 2016-06-11 NOTE — Patient Instructions (Signed)
Preventive Care for Adults, Female A healthy lifestyle and preventive care can promote health and wellness. Preventive health guidelines for women include the following key practices.  A routine yearly physical is a good way to check with your health care provider about your health and preventive screening. It is a chance to share any concerns and updates on your health and to receive a thorough exam.  Visit your dentist for a routine exam and preventive care every 6 months. Brush your teeth twice a day and floss once a day. Good oral hygiene prevents tooth decay and gum disease.  The frequency of eye exams is based on your age, health, family medical history, use of contact lenses, and other factors. Follow your health care provider's recommendations for frequency of eye exams.  Eat a healthy diet. Foods like vegetables, fruits, whole grains, low-fat dairy products, and lean protein foods contain the nutrients you need without too many calories. Decrease your intake of foods high in solid fats, added sugars, and salt. Eat the right amount of calories for you.Get information about a proper diet from your health care provider, if necessary.  Regular physical exercise is one of the most important things you can do for your health. Most adults should get at least 150 minutes of moderate-intensity exercise (any activity that increases your heart rate and causes you to sweat) each week. In addition, most adults need muscle-strengthening exercises on 2 or more days a week.  Maintain a healthy weight. The body mass index (BMI) is a screening tool to identify possible weight problems. It provides an estimate of body fat based on height and weight. Your health care provider can find your BMI and can help you achieve or maintain a healthy weight.For adults 20 years and older:  A BMI below 18.5 is considered underweight.  A BMI of 18.5 to 24.9 is normal.  A BMI of 25 to 29.9 is considered overweight.  A  BMI of 30 and above is considered obese.  Maintain normal blood lipids and cholesterol levels by exercising and minimizing your intake of saturated fat. Eat a balanced diet with plenty of fruit and vegetables. Blood tests for lipids and cholesterol should begin at age 20 and be repeated every 5 years. If your lipid or cholesterol levels are high, you are over 50, or you are at high risk for heart disease, you may need your cholesterol levels checked more frequently.Ongoing high lipid and cholesterol levels should be treated with medicines if diet and exercise are not working.  If you smoke, find out from your health care provider how to quit. If you do not use tobacco, do not start.  Lung cancer screening is recommended for adults aged 55-80 years who are at high risk for developing lung cancer because of a history of smoking. A yearly low-dose CT scan of the lungs is recommended for people who have at least a 30-pack-year history of smoking and are a current smoker or have quit within the past 15 years. A pack year of smoking is smoking an average of 1 pack of cigarettes a day for 1 year (for example: 1 pack a day for 30 years or 2 packs a day for 15 years). Yearly screening should continue until the smoker has stopped smoking for at least 15 years. Yearly screening should be stopped for people who develop a health problem that would prevent them from having lung cancer treatment.  If you are pregnant, do not drink alcohol. If you are   breastfeeding, be very cautious about drinking alcohol. If you are not pregnant and choose to drink alcohol, do not have more than 1 drink per day. One drink is considered to be 12 ounces (355 mL) of beer, 5 ounces (148 mL) of wine, or 1.5 ounces (44 mL) of liquor.  Avoid use of street drugs. Do not share needles with anyone. Ask for help if you need support or instructions about stopping the use of drugs.  High blood pressure causes heart disease and increases the risk  of stroke. Your blood pressure should be checked at least every 1 to 2 years. Ongoing high blood pressure should be treated with medicines if weight loss and exercise do not work.  If you are 55-79 years old, ask your health care provider if you should take aspirin to prevent strokes.  Diabetes screening is done by taking a blood sample to check your blood glucose level after you have not eaten for a certain period of time (fasting). If you are not overweight and you do not have risk factors for diabetes, you should be screened once every 3 years starting at age 45. If you are overweight or obese and you are 40-70 years of age, you should be screened for diabetes every year as part of your cardiovascular risk assessment.  Breast cancer screening is essential preventive care for women. You should practice "breast self-awareness." This means understanding the normal appearance and feel of your breasts and may include breast self-examination. Any changes detected, no matter how small, should be reported to a health care provider. Women in their 20s and 30s should have a clinical breast exam (CBE) by a health care provider as part of a regular health exam every 1 to 3 years. After age 40, women should have a CBE every year. Starting at age 40, women should consider having a mammogram (breast X-ray test) every year. Women who have a family history of breast cancer should talk to their health care provider about genetic screening. Women at a high risk of breast cancer should talk to their health care providers about having an MRI and a mammogram every year.  Breast cancer gene (BRCA)-related cancer risk assessment is recommended for women who have family members with BRCA-related cancers. BRCA-related cancers include breast, ovarian, tubal, and peritoneal cancers. Having family members with these cancers may be associated with an increased risk for harmful changes (mutations) in the breast cancer genes BRCA1 and  BRCA2. Results of the assessment will determine the need for genetic counseling and BRCA1 and BRCA2 testing.  Your health care provider may recommend that you be screened regularly for cancer of the pelvic organs (ovaries, uterus, and vagina). This screening involves a pelvic examination, including checking for microscopic changes to the surface of your cervix (Pap test). You may be encouraged to have this screening done every 3 years, beginning at age 21.  For women ages 30-65, health care providers may recommend pelvic exams and Pap testing every 3 years, or they may recommend the Pap and pelvic exam, combined with testing for human papilloma virus (HPV), every 5 years. Some types of HPV increase your risk of cervical cancer. Testing for HPV may also be done on women of any age with unclear Pap test results.  Other health care providers may not recommend any screening for nonpregnant women who are considered low risk for pelvic cancer and who do not have symptoms. Ask your health care provider if a screening pelvic exam is right for   you.  If you have had past treatment for cervical cancer or a condition that could lead to cancer, you need Pap tests and screening for cancer for at least 20 years after your treatment. If Pap tests have been discontinued, your risk factors (such as having a new sexual partner) need to be reassessed to determine if screening should resume. Some women have medical problems that increase the chance of getting cervical cancer. In these cases, your health care provider may recommend more frequent screening and Pap tests.  Colorectal cancer can be detected and often prevented. Most routine colorectal cancer screening begins at the age of 50 years and continues through age 75 years. However, your health care provider may recommend screening at an earlier age if you have risk factors for colon cancer. On a yearly basis, your health care provider may provide home test kits to check  for hidden blood in the stool. Use of a small camera at the end of a tube, to directly examine the colon (sigmoidoscopy or colonoscopy), can detect the earliest forms of colorectal cancer. Talk to your health care provider about this at age 50, when routine screening begins. Direct exam of the colon should be repeated every 5-10 years through age 75 years, unless early forms of precancerous polyps or small growths are found.  People who are at an increased risk for hepatitis B should be screened for this virus. You are considered at high risk for hepatitis B if:  You were born in a country where hepatitis B occurs often. Talk with your health care provider about which countries are considered high risk.  Your parents were born in a high-risk country and you have not received a shot to protect against hepatitis B (hepatitis B vaccine).  You have HIV or AIDS.  You use needles to inject street drugs.  You live with, or have sex with, someone who has hepatitis B.  You get hemodialysis treatment.  You take certain medicines for conditions like cancer, organ transplantation, and autoimmune conditions.  Hepatitis C blood testing is recommended for all people born from 1945 through 1965 and any individual with known risks for hepatitis C.  Practice safe sex. Use condoms and avoid high-risk sexual practices to reduce the spread of sexually transmitted infections (STIs). STIs include gonorrhea, chlamydia, syphilis, trichomonas, herpes, HPV, and human immunodeficiency virus (HIV). Herpes, HIV, and HPV are viral illnesses that have no cure. They can result in disability, cancer, and death.  You should be screened for sexually transmitted illnesses (STIs) including gonorrhea and chlamydia if:  You are sexually active and are younger than 24 years.  You are older than 24 years and your health care provider tells you that you are at risk for this type of infection.  Your sexual activity has changed  since you were last screened and you are at an increased risk for chlamydia or gonorrhea. Ask your health care provider if you are at risk.  If you are at risk of being infected with HIV, it is recommended that you take a prescription medicine daily to prevent HIV infection. This is called preexposure prophylaxis (PrEP). You are considered at risk if:  You are sexually active and do not regularly use condoms or know the HIV status of your partner(s).  You take drugs by injection.  You are sexually active with a partner who has HIV.  Talk with your health care provider about whether you are at high risk of being infected with HIV. If   you choose to begin PrEP, you should first be tested for HIV. You should then be tested every 3 months for as long as you are taking PrEP.  Osteoporosis is a disease in which the bones lose minerals and strength with aging. This can result in serious bone fractures or breaks. The risk of osteoporosis can be identified using a bone density scan. Women ages 67 years and over and women at risk for fractures or osteoporosis should discuss screening with their health care providers. Ask your health care provider whether you should take a calcium supplement or vitamin D to reduce the rate of osteoporosis.  Menopause can be associated with physical symptoms and risks. Hormone replacement therapy is available to decrease symptoms and risks. You should talk to your health care provider about whether hormone replacement therapy is right for you.  Use sunscreen. Apply sunscreen liberally and repeatedly throughout the day. You should seek shade when your shadow is shorter than you. Protect yourself by wearing long sleeves, pants, a wide-brimmed hat, and sunglasses year round, whenever you are outdoors.  Once a month, do a whole body skin exam, using a mirror to look at the skin on your back. Tell your health care provider of new moles, moles that have irregular borders, moles that  are larger than a pencil eraser, or moles that have changed in shape or color.  Stay current with required vaccines (immunizations).  Influenza vaccine. All adults should be immunized every year.  Tetanus, diphtheria, and acellular pertussis (Td, Tdap) vaccine. Pregnant women should receive 1 dose of Tdap vaccine during each pregnancy. The dose should be obtained regardless of the length of time since the last dose. Immunization is preferred during the 27th-36th week of gestation. An adult who has not previously received Tdap or who does not know her vaccine status should receive 1 dose of Tdap. This initial dose should be followed by tetanus and diphtheria toxoids (Td) booster doses every 10 years. Adults with an unknown or incomplete history of completing a 3-dose immunization series with Td-containing vaccines should begin or complete a primary immunization series including a Tdap dose. Adults should receive a Td booster every 10 years.  Varicella vaccine. An adult without evidence of immunity to varicella should receive 2 doses or a second dose if she has previously received 1 dose. Pregnant females who do not have evidence of immunity should receive the first dose after pregnancy. This first dose should be obtained before leaving the health care facility. The second dose should be obtained 4-8 weeks after the first dose.  Human papillomavirus (HPV) vaccine. Females aged 13-26 years who have not received the vaccine previously should obtain the 3-dose series. The vaccine is not recommended for use in pregnant females. However, pregnancy testing is not needed before receiving a dose. If a female is found to be pregnant after receiving a dose, no treatment is needed. In that case, the remaining doses should be delayed until after the pregnancy. Immunization is recommended for any person with an immunocompromised condition through the age of 61 years if she did not get any or all doses earlier. During the  3-dose series, the second dose should be obtained 4-8 weeks after the first dose. The third dose should be obtained 24 weeks after the first dose and 16 weeks after the second dose.  Zoster vaccine. One dose is recommended for adults aged 30 years or older unless certain conditions are present.  Measles, mumps, and rubella (MMR) vaccine. Adults born  before 1957 generally are considered immune to measles and mumps. Adults born in 1957 or later should have 1 or more doses of MMR vaccine unless there is a contraindication to the vaccine or there is laboratory evidence of immunity to each of the three diseases. A routine second dose of MMR vaccine should be obtained at least 28 days after the first dose for students attending postsecondary schools, health care workers, or international travelers. People who received inactivated measles vaccine or an unknown type of measles vaccine during 1963-1967 should receive 2 doses of MMR vaccine. People who received inactivated mumps vaccine or an unknown type of mumps vaccine before 1979 and are at high risk for mumps infection should consider immunization with 2 doses of MMR vaccine. For females of childbearing age, rubella immunity should be determined. If there is no evidence of immunity, females who are not pregnant should be vaccinated. If there is no evidence of immunity, females who are pregnant should delay immunization until after pregnancy. Unvaccinated health care workers born before 1957 who lack laboratory evidence of measles, mumps, or rubella immunity or laboratory confirmation of disease should consider measles and mumps immunization with 2 doses of MMR vaccine or rubella immunization with 1 dose of MMR vaccine.  Pneumococcal 13-valent conjugate (PCV13) vaccine. When indicated, a person who is uncertain of his immunization history and has no record of immunization should receive the PCV13 vaccine. All adults 65 years of age and older should receive this  vaccine. An adult aged 19 years or older who has certain medical conditions and has not been previously immunized should receive 1 dose of PCV13 vaccine. This PCV13 should be followed with a dose of pneumococcal polysaccharide (PPSV23) vaccine. Adults who are at high risk for pneumococcal disease should obtain the PPSV23 vaccine at least 8 weeks after the dose of PCV13 vaccine. Adults older than 61 years of age who have normal immune system function should obtain the PPSV23 vaccine dose at least 1 year after the dose of PCV13 vaccine.  Pneumococcal polysaccharide (PPSV23) vaccine. When PCV13 is also indicated, PCV13 should be obtained first. All adults aged 65 years and older should be immunized. An adult younger than age 65 years who has certain medical conditions should be immunized. Any person who resides in a nursing home or long-term care facility should be immunized. An adult smoker should be immunized. People with an immunocompromised condition and certain other conditions should receive both PCV13 and PPSV23 vaccines. People with human immunodeficiency virus (HIV) infection should be immunized as soon as possible after diagnosis. Immunization during chemotherapy or radiation therapy should be avoided. Routine use of PPSV23 vaccine is not recommended for American Indians, [place] Natives, or people younger than 65 years unless there are medical conditions that require PPSV23 vaccine. When indicated, people who have unknown immunization and have no record of immunization should receive PPSV23 vaccine. One-time revaccination 5 years after the first dose of PPSV23 is recommended for people aged 19-64 years who have chronic kidney failure, nephrotic syndrome, asplenia, or immunocompromised conditions. People who received 1-2 doses of PPSV23 before age 65 years should receive another dose of PPSV23 vaccine at age 65 years or later if at least 5 years have passed since the previous dose. Doses of PPSV23 are not  needed for people immunized with PPSV23 at or after age 65 years.  Meningococcal vaccine. Adults with asplenia or persistent complement component deficiencies should receive 2 doses of quadrivalent meningococcal conjugate (MenACWY-D) vaccine. The doses should be obtained   at least 2 months apart. Microbiologists working with certain meningococcal bacteria, [place] recruits, people at risk during an outbreak, and people who travel to or live in countries with a high rate of meningitis should be immunized. A first-year college student up through age 34 years who is living in a residence hall should receive a dose if she did not receive a dose on or after her 16th birthday. Adults who have certain high-risk conditions should receive one or more doses of vaccine.  Hepatitis A vaccine. Adults who wish to be protected from this disease, have certain high-risk conditions, work with hepatitis A-infected animals, work in hepatitis A research labs, or travel to or work in countries with a high rate of hepatitis A should be immunized. Adults who were previously unvaccinated and who anticipate close contact with an international adoptee during the first 60 days after arrival in the [place] from a country with a high rate of hepatitis A should be immunized.  Hepatitis B vaccine. Adults who wish to be protected from this disease, have certain high-risk conditions, may be exposed to blood or other infectious body fluids, are household contacts or sex partners of hepatitis B positive people, are clients or workers in certain care facilities, or travel to or work in countries with a high rate of hepatitis B should be immunized.  Haemophilus influenzae type b (Hib) vaccine. A previously unvaccinated person with asplenia or sickle cell disease or having a scheduled splenectomy should receive 1 dose of Hib vaccine. Regardless of previous immunization, a recipient of a hematopoietic stem cell transplant should receive a  3-dose series 6-12 months after her successful transplant. Hib vaccine is not recommended for adults with HIV infection. Preventive Services / Frequency Ages 35 to 4 years  Blood pressure check.** / Every 3-5 years.  Lipid and cholesterol check.** / Every 5 years beginning at age 60.  Clinical breast exam.** / Every 3 years for women in their 71s and 10s.  BRCA-related cancer risk assessment.** / For women who have family members with a BRCA-related cancer (breast, ovarian, tubal, or peritoneal cancers).  Pap test.** / Every 2 years from ages 76 through 26. Every 3 years starting at age 61 through age 76 or 93 with a history of 3 consecutive normal Pap tests.  HPV screening.** / Every 3 years from ages 37 through ages 60 to 51 with a history of 3 consecutive normal Pap tests.  Hepatitis C blood test.** / For any individual with known risks for hepatitis C.  Skin self-exam. / Monthly.  Influenza vaccine. / Every year.  Tetanus, diphtheria, and acellular pertussis (Tdap, Td) vaccine.** / Consult your health care provider. Pregnant women should receive 1 dose of Tdap vaccine during each pregnancy. 1 dose of Td every 10 years.  Varicella vaccine.** / Consult your health care provider. Pregnant females who do not have evidence of immunity should receive the first dose after pregnancy.  HPV vaccine. / 3 doses over 6 months, if 93 and younger. The vaccine is not recommended for use in pregnant females. However, pregnancy testing is not needed before receiving a dose.  Measles, mumps, rubella (MMR) vaccine.** / You need at least 1 dose of MMR if you were born in 1957 or later. You may also need a 2nd dose. For females of childbearing age, rubella immunity should be determined. If there is no evidence of immunity, females who are not pregnant should be vaccinated. If there is no evidence of immunity, females who are  pregnant should delay immunization until after pregnancy.  Pneumococcal  13-valent conjugate (PCV13) vaccine.** / Consult your health care provider.  Pneumococcal polysaccharide (PPSV23) vaccine.** / 1 to 2 doses if you smoke cigarettes or if you have certain conditions.  Meningococcal vaccine.** / 1 dose if you are age 68 to 8 years and a Market researcher living in a residence hall, or have one of several medical conditions, you need to get vaccinated against meningococcal disease. You may also need additional booster doses.  Hepatitis A vaccine.** / Consult your health care provider.  Hepatitis B vaccine.** / Consult your health care provider.  Haemophilus influenzae type b (Hib) vaccine.** / Consult your health care provider. Ages 7 to 53 years  Blood pressure check.** / Every year.  Lipid and cholesterol check.** / Every 5 years beginning at age 25 years.  Lung cancer screening. / Every year if you are aged 11-80 years and have a 30-pack-year history of smoking and currently smoke or have quit within the past 15 years. Yearly screening is stopped once you have quit smoking for at least 15 years or develop a health problem that would prevent you from having lung cancer treatment.  Clinical breast exam.** / Every year after age 48 years.  BRCA-related cancer risk assessment.** / For women who have family members with a BRCA-related cancer (breast, ovarian, tubal, or peritoneal cancers).  Mammogram.** / Every year beginning at age 41 years and continuing for as long as you are in good health. Consult with your health care provider.  Pap test.** / Every 3 years starting at age 65 years through age 37 or 70 years with a history of 3 consecutive normal Pap tests.  HPV screening.** / Every 3 years from ages 72 years through ages 60 to 40 years with a history of 3 consecutive normal Pap tests.  Fecal occult blood test (FOBT) of stool. / Every year beginning at age 21 years and continuing until age 5 years. You may not need to do this test if you get  a colonoscopy every 10 years.  Flexible sigmoidoscopy or colonoscopy.** / Every 5 years for a flexible sigmoidoscopy or every 10 years for a colonoscopy beginning at age 35 years and continuing until age 48 years.  Hepatitis C blood test.** / For all people born from [birth year] through 1965 and any individual with known risks for hepatitis C.  Skin self-exam. / Monthly.  Influenza vaccine. / Every year.  Tetanus, diphtheria, and acellular pertussis (Tdap/Td) vaccine.** / Consult your health care provider. Pregnant women should receive 1 dose of Tdap vaccine during each pregnancy. 1 dose of Td every 10 years.  Varicella vaccine.** / Consult your health care provider. Pregnant females who do not have evidence of immunity should receive the first dose after pregnancy.  Zoster vaccine.** / 1 dose for adults aged 30 years or older.  Measles, mumps, rubella (MMR) vaccine.** / You need at least 1 dose of MMR if you were born in 1957 or later. You may also need a second dose. For females of childbearing age, rubella immunity should be determined. If there is no evidence of immunity, females who are not pregnant should be vaccinated. If there is no evidence of immunity, females who are pregnant should delay immunization until after pregnancy.  Pneumococcal 13-valent conjugate (PCV13) vaccine.** / Consult your health care provider.  Pneumococcal polysaccharide (PPSV23) vaccine.** / 1 to 2 doses if you smoke cigarettes or if you have certain conditions.  Meningococcal vaccine.** /  Consult your health care provider.  Hepatitis A vaccine.** / Consult your health care provider.  Hepatitis B vaccine.** / Consult your health care provider.  Haemophilus influenzae type b (Hib) vaccine.** / Consult your health care provider. Ages 64 years and over  Blood pressure check.** / Every year.  Lipid and cholesterol check.** / Every 5 years beginning at age 23 years.  Lung cancer screening. / Every year if you  are aged 16-80 years and have a 30-pack-year history of smoking and currently smoke or have quit within the past 15 years. Yearly screening is stopped once you have quit smoking for at least 15 years or develop a health problem that would prevent you from having lung cancer treatment.  Clinical breast exam.** / Every year after age 74 years.  BRCA-related cancer risk assessment.** / For women who have family members with a BRCA-related cancer (breast, ovarian, tubal, or peritoneal cancers).  Mammogram.** / Every year beginning at age 44 years and continuing for as long as you are in good health. Consult with your health care provider.  Pap test.** / Every 3 years starting at age 58 years through age 22 or 39 years with 3 consecutive normal Pap tests. Testing can be stopped between 65 and 70 years with 3 consecutive normal Pap tests and no abnormal Pap or HPV tests in the past 10 years.  HPV screening.** / Every 3 years from ages 64 years through ages 70 or 61 years with a history of 3 consecutive normal Pap tests. Testing can be stopped between 65 and 70 years with 3 consecutive normal Pap tests and no abnormal Pap or HPV tests in the past 10 years.  Fecal occult blood test (FOBT) of stool. / Every year beginning at age 40 years and continuing until age 27 years. You may not need to do this test if you get a colonoscopy every 10 years.  Flexible sigmoidoscopy or colonoscopy.** / Every 5 years for a flexible sigmoidoscopy or every 10 years for a colonoscopy beginning at age 7 years and continuing until age 32 years.  Hepatitis C blood test.** / For all people born from [birth year] through 1966 and any individual with known risks for hepatitis C.  Osteoporosis screening.** / A one-time screening for women ages 30 years and over and women at risk for fractures or osteoporosis.  Skin self-exam. / Monthly.  Influenza vaccine. / Every year.  Tetanus, diphtheria, and acellular pertussis (Tdap/Td)  vaccine.** / 1 dose of Td every 10 years.  Varicella vaccine.** / Consult your health care provider.  Zoster vaccine.** / 1 dose for adults aged 35 years or older.  Pneumococcal 13-valent conjugate (PCV13) vaccine.** / Consult your health care provider.  Pneumococcal polysaccharide (PPSV23) vaccine.** / 1 dose for all adults aged 46 years and older.  Meningococcal vaccine.** / Consult your health care provider.  Hepatitis A vaccine.** / Consult your health care provider.  Hepatitis B vaccine.** / Consult your health care provider.  Haemophilus influenzae type b (Hib) vaccine.** / Consult your health care provider. ** Family history and personal history of risk and conditions may change your health care provider's recommendations.   This information is not intended to replace advice given to you by your health care provider. Make sure you discuss any questions you have with your health care provider.   Document Released: 10/30/2001 Document Revised: 09/24/2014 Document Reviewed: 01/29/2011 Elsevier Interactive Patient Education [employer].

## 2016-06-11 NOTE — Progress Notes (Signed)
Pre visit review using our clinic review tool, if applicable. No additional management support is needed unless otherwise documented below in the visit note. 

## 2016-06-11 NOTE — Progress Notes (Signed)
Subjective:     ICLE MCCLARD is a 60 y.o. female and is here for a comprehensive physical exam. The patient reports no problems.  Social History   Social History  . Marital status: Married    Spouse name: N/A  . Number of children: 1  . Years of education: N/A   Occupational History  . nurse-- [employer]   Social History Main Topics  . Smoking status: Never Smoker  . Smokeless tobacco: Never Used  . Alcohol use 2.4 - 3.0 oz/week    4 - 5 Glasses of wine per week  . Drug use: No  . Sexual activity: Yes    Partners: Male   Other Topics Concern  . Not on file   Social History Narrative   Exercise-- no   Health Maintenance  Topic Date Due  . TETANUS/TDAP  01/30/2007  . MAMMOGRAM  03/26/2014  . PAP SMEAR  01/30/2015  . INFLUENZA VACCINE  06/11/2017 (Originally 04/17/2016)  . ZOSTAVAX  06/11/2017 (Originally 12/09/2014)  . Hepatitis C Screening  06/11/2017 (Originally [birth year])  . HIV Screening  06/11/2017 (Originally 12/08/1969)  . 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 She  does not have any pertinent problems on file. She  has a past surgical history that includes Combined hysterectomy abdominal w/ MMK / Burch procedure (2000). Her family history includes Cancer in her father and paternal aunt; Cancer (age of onset: 90) in her mother; Hypertension in her mother; Ovarian cancer in her mother; Prostate cancer in her father. She  reports that she has never smoked. She has never used smokeless tobacco. She reports that she drinks about 2.4 - 3.0 oz of alcohol per week . She reports that she does not use drugs. She currently has no medications in their medication list. No current outpatient prescriptions on file prior to visit.   No current facility-administered  medications on file prior to visit.    She has No Known Allergies..  Review of Systems Review of Systems  Constitutional: Negative for activity change, appetite change and fatigue.  HENT: Negative for hearing loss, congestion, tinnitus and ear discharge.  dentist q69m Eyes: Negative for visual disturbance (see optho q1y -- vision corrected to 20/20 with glasses).  Respiratory: Negative for cough, chest tightness and shortness of breath.   Cardiovascular: Negative for chest pain, palpitations and leg swelling.  Gastrointestinal: Negative for abdominal pain, diarrhea, constipation and abdominal distention.  Genitourinary: Negative for urgency, frequency, decreased urine volume and difficulty urinating.  Musculoskeletal: Negative for back pain, arthralgias and gait problem.  Skin: Negative for color change, pallor and rash.  Neurological: Negative for dizziness, light-headedness, numbness and headaches.  Hematological: Negative for adenopathy. Does not bruise/bleed easily.  Psychiatric/Behavioral: Negative for suicidal ideas, confusion, sleep disturbance, self-injury, dysphoric mood, decreased concentration and agitation.       Objective:    BP 111/76 (BP Location: Right Arm, Patient Position: Sitting, Cuff Size: Normal)   Pulse 80   Temp 98.3 F (36.8 C) (Oral)   Resp 16   Ht 5\' 7"  (1.702 m)   Wt 156 lb 9.6 oz (71 kg)   SpO2 99%   BMI 24.53 kg/m  General appearance: alert, cooperative, appears stated age and no distress Head: Normocephalic, without obvious abnormality, atraumatic Eyes: conjunctivae/corneas clear. PERRL, EOM's intact. Fundi benign.  Ears: normal TM's and external ear canals both ears Nose: Nares normal. Septum midline. Mucosa normal. No drainage or sinus tenderness. Throat: lips, mucosa, and tongue normal; teeth and gums normal Neck: no adenopathy, no carotid bruit, no JVD, supple, symmetrical, trachea midline and thyroid not enlarged, symmetric, no  tenderness/mass/nodules Back: symmetric, no curvature. ROM normal. No CVA tenderness. Lungs: clear to auscultation bilaterally Breasts: normal appearance, no masses or tenderness Heart: regular rate and rhythm, S1, S2 normal, no murmur, click, rub or gallop Abdomen: soft, non-tender; bowel sounds normal; no masses,  no organomegaly Pelvic: external genitalia normal, uterus surgically absent and vaginal smear done Rectal- heme neg brown stool Extremities: extremities normal, atraumatic, no cyanosis or edema Pulses: 2+ and symmetric Skin: Skin color, texture, turgor normal. No rashes or lesions Lymph nodes: Cervical, supraclavicular, and axillary nodes normal. Neurologic: Alert and oriented X 3, normal strength and tone. Normal symmetric reflexes. Normal coordination and gait    Assessment:    Healthy female exam.     Plan:    ghm utd Check labs See After Visit Summary for Counseling Recommendations

## 2016-06-12 ENCOUNTER — Encounter: Payer: Self-pay | Admitting: Family Medicine

## 2016-06-12 LAB — URINE CULTURE: Organism ID, Bacteria: NO GROWTH

## 2016-06-12 LAB — CYTOLOGY - PAP

## 2016-06-19 ENCOUNTER — Ambulatory Visit (HOSPITAL_BASED_OUTPATIENT_CLINIC_OR_DEPARTMENT_OTHER)
Admission: RE | Admit: 2016-06-19 | Discharge: 2016-06-19 | Disposition: A | Discharge status: Home / Self Care | Payer: Managed Care, Other (non HMO) | Source: Ambulatory Visit | Attending: Family Medicine | Admitting: Family Medicine

## 2016-06-19 DIAGNOSIS — Z1231 Encounter for screening mammogram for malignant neoplasm of breast: Secondary | ICD-10-CM | POA: Diagnosis not present

## 2016-06-19 DIAGNOSIS — Z0000 Encounter for general adult medical examination without abnormal findings: Secondary | ICD-10-CM

## 2016-06-19 DIAGNOSIS — Z1239 Encounter for other screening for malignant neoplasm of breast: Secondary | ICD-10-CM

## 2017-07-10 ENCOUNTER — Encounter: Payer: Self-pay | Admitting: Family Medicine

## 2017-07-12 ENCOUNTER — Telehealth: Payer: Self-pay | Admitting: Family Medicine

## 2017-07-12 DIAGNOSIS — Z1239 Encounter for other screening for malignant neoplasm of breast: Secondary | ICD-10-CM

## 2017-07-12 NOTE — Telephone Encounter (Signed)
Pt called in to schedule her cpe and also to request a mammogram. Pt says that she had her last mammogram downstairs at [place].    Pt is coming in for CPE in November.    Please place mamo orders.    Thanks.

## 2017-07-15 ENCOUNTER — Encounter: Payer: Self-pay | Admitting: Family Medicine

## 2017-07-15 NOTE — Telephone Encounter (Signed)
Orders have been placed. Message sent to pt via mychart.

## 2017-07-18 ENCOUNTER — Ambulatory Visit (HOSPITAL_BASED_OUTPATIENT_CLINIC_OR_DEPARTMENT_OTHER)
Admission: RE | Admit: 2017-07-18 | Discharge: 2017-07-18 | Disposition: A | Discharge status: Home / Self Care | Payer: 59 | Source: Ambulatory Visit | Attending: Family Medicine | Admitting: Family Medicine

## 2017-07-18 DIAGNOSIS — Z1231 Encounter for screening mammogram for malignant neoplasm of breast: Secondary | ICD-10-CM | POA: Insufficient documentation

## 2017-07-18 DIAGNOSIS — Z1239 Encounter for other screening for malignant neoplasm of breast: Secondary | ICD-10-CM

## 2017-07-23 ENCOUNTER — Ambulatory Visit (INDEPENDENT_AMBULATORY_CARE_PROVIDER_SITE_OTHER): Payer: 59 | Admitting: Family Medicine

## 2017-07-23 ENCOUNTER — Encounter: Payer: Self-pay | Admitting: Family Medicine

## 2017-07-23 VITALS — BP 157/76 | HR 65 | Temp 98.0°F | Resp 16 | Ht 67.0 in | Wt 168.6 lb

## 2017-07-23 DIAGNOSIS — Z23 Encounter for immunization: Secondary | ICD-10-CM | POA: Diagnosis not present

## 2017-07-23 DIAGNOSIS — Z0000 Encounter for general adult medical examination without abnormal findings: Secondary | ICD-10-CM

## 2017-07-23 LAB — POC URINALSYSI DIPSTICK (AUTOMATED)
Bilirubin, UA: NEGATIVE
GLUCOSE UA: NEGATIVE
KETONES UA: NEGATIVE
Leukocytes, UA: NEGATIVE
Nitrite, UA: NEGATIVE
PH UA: 6 (ref 5.0–8.0)
Protein, UA: NEGATIVE
RBC UA: NEGATIVE
SPEC GRAV UA: 1.025 (ref 1.010–1.025)
Urobilinogen, UA: 0.2 E.U./dL

## 2017-07-23 NOTE — Progress Notes (Signed)
Subjective:     Sara Martinez is a 61 y.o. female and is here for a comprehensive physical exam. The patient reports no problems.  Social History   Socioeconomic History  . Marital status: Married    Spouse name: Not on file  . Number of children: 1  . Years of education: Not on file  . Highest education level: Not on file  Social Needs  . Financial resource strain: Not on file  . Food insecurity - worry: Not on file  . Food insecurity - inability: Not on file  . Transportation needs - medical: Not on file  . Transportation needs - non-medical: Not on file  Occupational History  . Occupation: nurse-- [employer]  Tobacco Use  . Smoking status: Never Smoker  . Smokeless tobacco: Never Used  Substance and Sexual Activity  . Alcohol use: Yes    Alcohol/week: 2.4 - 3.0 oz    Types: 4 - 5 Glasses of wine per week  . Drug use: No  . Sexual activity: Yes    Partners: Male  Other Topics Concern  . Not on file  Social History Narrative   Exercise-- walk for 30 minutes every morning   Health Maintenance  Topic Date Due  . Hepatitis C Screening  07/23/2028 (Originally 1955-10-10)  . HIV Screening  07/23/2028 (Originally 12/08/1969)  . PAP SMEAR  06/12/2019  . MAMMOGRAM  07/19/2019  . COLONOSCOPY  06/16/2020  . TETANUS/TDAP  06/11/2026  . 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 She does not have any pertinent problems on file. She  has a past surgical history that includes Combined hysterectomy abdominal w/ MMK / Burch procedure (2000) and Abdominal hysterectomy. Her family history includes Arthritis in her father and mother; Cancer in her father and paternal aunt; Cancer (age of onset: 64) in her mother; Hypertension in her mother; Ovarian cancer in her mother; Prostate cancer in  her father. She  reports that  has never smoked. she has never used smokeless tobacco. She reports that she drinks about 2.4 - 3.0 oz of alcohol per week. She reports that she does not use drugs. She currently has no medications in their medication list. No current outpatient medications on file prior to visit.   No current facility-administered medications on file prior to visit.    She has No Known Allergies..  Review of Systems Review of Systems  Constitutional: Negative for activity change, appetite change and fatigue.  HENT: Negative for hearing loss, congestion, tinnitus and ear discharge.  dentist q55m Eyes: Negative for visual disturbance (see optho q1y -- vision corrected to 20/20 with glasses).  Respiratory: Negative for cough, chest tightness and shortness of breath.   Cardiovascular: Negative for chest pain, palpitations and leg swelling.  Gastrointestinal: Negative for abdominal pain, diarrhea, constipation and abdominal distention.  Genitourinary: Negative for urgency, frequency, decreased urine volume and difficulty urinating.  Musculoskeletal: Negative for back pain, arthralgias and gait problem.  Skin: Negative for color change, pallor and rash.  Neurological: Negative for dizziness, light-headedness, numbness and headaches.  Hematological: Negative for adenopathy. Does not bruise/bleed easily.  Psychiatric/Behavioral: Negative for suicidal ideas, confusion, sleep disturbance, self-injury, dysphoric mood, decreased concentration and agitation.       Objective:    BP (!) 157/76   Pulse 65   Temp 98 F (  36.7 C) (Oral)   Resp 16   Ht 5\' 7"  (1.702 m)   Wt 168 lb 9.6 oz (76.5 kg)   SpO2 98%   BMI 26.41 kg/m  General appearance: alert, cooperative, appears stated age and no distress Head: Normocephalic, without obvious abnormality, atraumatic Eyes: negative findings: lids and lashes normal and pupils equal, round, reactive to light and accomodation Ears: normal  TM's and external ear canals both ears Nose: Nares normal. Septum midline. Mucosa normal. No drainage or sinus tenderness. Throat: lips, mucosa, and tongue normal; teeth and gums normal Neck: no adenopathy, no carotid bruit, no JVD, supple, symmetrical, trachea midline and thyroid not enlarged, symmetric, no tenderness/mass/nodules Back: symmetric, no curvature. ROM normal. No CVA tenderness. Lungs: clear to auscultation bilaterally Breasts: normal appearance, no masses or tenderness Heart: regular rate and rhythm, S1, S2 normal, no murmur, click, rub or gallop Abdomen: soft, non-tender; bowel sounds normal; no masses,  no organomegaly Pelvic: deferred Extremities: extremities normal, atraumatic, no cyanosis or edema Pulses: 2+ and symmetric Skin: Skin color, texture, turgor normal. No rashes or lesions Lymph nodes: Cervical, supraclavicular, and axillary nodes normal. Neurologic: Alert and oriented X 3, normal strength and tone. Normal symmetric reflexes. Normal coordination and gait    Assessment:    Healthy female exam.      Plan:    ghm utd Check labs  See After Visit Summary for Counseling Recommendations

## 2017-07-23 NOTE — Patient Instructions (Signed)
Preventive Care 40-64 Years, Female Preventive care refers to lifestyle choices and visits with your health care provider that can promote health and wellness. What does preventive care include?  A yearly physical exam. This is also called an annual well check.  Dental exams once or twice a year.  Routine eye exams. Ask your health care provider how often you should have your eyes checked.  Personal lifestyle choices, including: ? Daily care of your teeth and gums. ? Regular physical activity. ? Eating a healthy diet. ? Avoiding tobacco and drug use. ? Limiting alcohol use. ? Practicing safe sex. ? Taking low-dose aspirin daily starting at age 58. ? Taking vitamin and mineral supplements as recommended by your health care provider. What happens during an annual well check? The services and screenings done by your health care provider during your annual well check will depend on your age, overall health, lifestyle risk factors, and family history of disease. Counseling Your health care provider may ask you questions about your:  Alcohol use.  Tobacco use.  Drug use.  Emotional well-being.  Home and relationship well-being.  Sexual activity.  Eating habits.  Work and work Statistician.  Method of birth control.  Menstrual cycle.  Pregnancy history.  Screening You may have the following tests or measurements:  Height, weight, and BMI.  Blood pressure.  Lipid and cholesterol levels. These may be checked every 5 years, or more frequently if you are over 81 years old.  Skin check.  Lung cancer screening. You may have this screening every year starting at age 78 if you have a 30-pack-year history of smoking and currently smoke or have quit within the past 15 years.  Fecal occult blood test (FOBT) of the stool. You may have this test every year starting at age 65.  Flexible sigmoidoscopy or colonoscopy. You may have a sigmoidoscopy every 5 years or a colonoscopy  every 10 years starting at age 30.  Hepatitis C blood test.  Hepatitis B blood test.  Sexually transmitted disease (STD) testing.  Diabetes screening. This is done by checking your blood sugar (glucose) after you have not eaten for a while (fasting). You may have this done every 1-3 years.  Mammogram. This may be done every 1-2 years. Talk to your health care provider about when you should start having regular mammograms. This may depend on whether you have a family history of breast cancer.  BRCA-related cancer screening. This may be done if you have a family history of breast, ovarian, tubal, or peritoneal cancers.  Pelvic exam and Pap test. This may be done every 3 years starting at age 80. Starting at age 36, this may be done every 5 years if you have a Pap test in combination with an HPV test.  Bone density scan. This is done to screen for osteoporosis. You may have this scan if you are at high risk for osteoporosis.  Discuss your test results, treatment options, and if necessary, the need for more tests with your health care provider. Vaccines Your health care provider may recommend certain vaccines, such as:  Influenza vaccine. This is recommended every year.  Tetanus, diphtheria, and acellular pertussis (Tdap, Td) vaccine. You may need a Td booster every 10 years.  Varicella vaccine. You may need this if you have not been vaccinated.  Zoster vaccine. You may need this after age 5.  Measles, mumps, and rubella (MMR) vaccine. You may need at least one dose of MMR if you were born in  1958 or later. You may also need a second dose.  Pneumococcal 13-valent conjugate (PCV13) vaccine. You may need this if you have certain conditions and were not previously vaccinated.  Pneumococcal polysaccharide (PPSV23) vaccine. You may need one or two doses if you smoke cigarettes or if you have certain conditions.  Meningococcal vaccine. You may need this if you have certain  conditions.  Hepatitis A vaccine. You may need this if you have certain conditions or if you travel or work in places where you may be exposed to hepatitis A.  Hepatitis B vaccine. You may need this if you have certain conditions or if you travel or work in places where you may be exposed to hepatitis B.  Haemophilus influenzae type b (Hib) vaccine. You may need this if you have certain conditions.  Talk to your health care provider about which screenings and vaccines you need and how often you need them. This information is not intended to replace advice given to you by your health care provider. Make sure you discuss any questions you have with your health care provider. Document Released: 09/30/2015 Document Revised: 05/23/2016 Document Reviewed: 07/05/2015 Elsevier Interactive Patient Education  2017 [employer].

## 2017-07-24 LAB — COMPREHENSIVE METABOLIC PANEL
ALBUMIN: 4.2 g/dL (ref 3.5–5.2)
ALT: 13 U/L (ref 0–35)
AST: 19 U/L (ref 0–37)
Alkaline Phosphatase: 68 U/L (ref 39–117)
BUN: 14 mg/dL (ref 6–23)
CALCIUM: 9.2 mg/dL (ref 8.4–10.5)
CHLORIDE: 105 meq/L (ref 96–112)
CO2: 29 mEq/L (ref 19–32)
CREATININE: 0.86 mg/dL (ref 0.40–1.20)
GFR: 70.92 mL/min (ref >60.00)
Glucose, Bld: 87 mg/dL (ref 70–99)
POTASSIUM: 3.8 meq/L (ref 3.5–5.1)
Sodium: 140 mEq/L (ref 135–145)
Total Bilirubin: 0.4 mg/dL (ref 0.2–1.2)
Total Protein: 7.1 g/dL (ref 6.0–8.3)

## 2017-07-24 LAB — CBC WITH DIFFERENTIAL/PLATELET
BASOS PCT: 1.6 % (ref 0.0–3.0)
Basophils Absolute: 0.1 10*3/uL (ref 0.0–0.1)
EOS PCT: 1 % (ref 0.0–5.0)
Eosinophils Absolute: 0.1 10*3/uL (ref 0.0–0.7)
HEMATOCRIT: 39.4 % (ref 36.0–46.0)
HEMOGLOBIN: 12.5 g/dL (ref 12.0–15.0)
LYMPHS PCT: 27.1 % (ref 12.0–46.0)
Lymphs Abs: 2.4 10*3/uL (ref 0.7–4.0)
MCHC: 31.8 g/dL (ref 30.0–36.0)
MCV: 82.9 fl (ref 78.0–100.0)
Monocytes Absolute: 0.7 10*3/uL (ref 0.1–1.0)
Monocytes Relative: 8.3 % (ref 3.0–12.0)
NEUTROS ABS: 5.4 10*3/uL (ref 1.4–7.7)
Neutrophils Relative %: 62 % (ref 43.0–77.0)
Platelets: 138 10*3/uL — ABNORMAL LOW (ref 150.0–400.0)
RBC: 4.75 Mil/uL (ref 3.87–5.11)
RDW: 13.8 % (ref 11.5–15.5)
WBC: 8.8 10*3/uL (ref 4.0–10.5)

## 2017-07-24 LAB — LIPID PANEL
Cholesterol: 190 mg/dL (ref 0–200)
HDL: 82.1 mg/dL (ref >39.00)
LDL Cholesterol: 97 mg/dL (ref 0–99)
NonHDL: 108.33
Total CHOL/HDL Ratio: 2
Triglycerides: 56 mg/dL (ref 0.0–149.0)
VLDL: 11.2 mg/dL (ref 0.0–40.0)

## 2017-07-24 LAB — TSH: TSH: 1.71 u[IU]/mL (ref 0.35–4.50)

## 2017-07-24 NOTE — Addendum Note (Signed)
Addended by: Kem Boroughs D on: 07/24/2017 08:37 AM   Modules accepted: Orders

## 2017-07-26 ENCOUNTER — Telehealth: Payer: Self-pay | Admitting: *Deleted

## 2017-07-26 DIAGNOSIS — R799 Abnormal finding of blood chemistry, unspecified: Secondary | ICD-10-CM

## 2017-07-26 NOTE — Telephone Encounter (Signed)
See my chart message

## 2018-01-08 ENCOUNTER — Telehealth: Payer: Self-pay | Admitting: *Deleted

## 2018-01-08 NOTE — Telephone Encounter (Signed)
Patient called yesterday in regards to a letter that was received from her insurance that Dr. Etter Sjogren is no longer taking [employer].    Left message on machine that we are currently working on that issue as we are unaware of this.

## 2018-01-15 NOTE — Telephone Encounter (Signed)
Left detailed message on machine that insurance issue has been resolved.

## 2018-02-04 ENCOUNTER — Telehealth: Payer: Self-pay | Admitting: *Deleted

## 2018-02-04 NOTE — Telephone Encounter (Signed)
Records indicate pt is still due for 2nd Shingrix vaccine. Left detailed message on voicemail to call and schedule nurse visit if vaccine is still needed and if vaccine was received at local pharmacy to let us know date of last vaccine.

## 2018-03-14 ENCOUNTER — Telehealth: Payer: Self-pay | Admitting: *Deleted

## 2018-03-14 NOTE — Telephone Encounter (Signed)
Left detailed message on pt's voicemail to call and schedule nurse visit for 2nd shingrix vaccine if still needed. This is the 2nd telephone attempt to reach pt. [place] for [facility] / Triage to schedule nurse visit for vaccine.

## 2018-07-25 ENCOUNTER — Encounter: Payer: Self-pay | Admitting: Family Medicine

## 2018-07-25 ENCOUNTER — Ambulatory Visit (INDEPENDENT_AMBULATORY_CARE_PROVIDER_SITE_OTHER): Payer: 59 | Admitting: Family Medicine

## 2018-07-25 VITALS — BP 127/64 | HR 63 | Temp 98.2°F | Resp 16 | Ht 66.8 in | Wt 152.2 lb

## 2018-07-25 DIAGNOSIS — Z1159 Encounter for screening for other viral diseases: Secondary | ICD-10-CM | POA: Diagnosis not present

## 2018-07-25 DIAGNOSIS — Z1239 Encounter for other screening for malignant neoplasm of breast: Secondary | ICD-10-CM | POA: Diagnosis not present

## 2018-07-25 DIAGNOSIS — Z0000 Encounter for general adult medical examination without abnormal findings: Secondary | ICD-10-CM

## 2018-07-25 DIAGNOSIS — E2839 Other primary ovarian failure: Secondary | ICD-10-CM

## 2018-07-25 DIAGNOSIS — L719 Rosacea, unspecified: Secondary | ICD-10-CM

## 2018-07-25 MED ORDER — METRONIDAZOLE 1 % EX GEL: Freq: Every day | CUTANEOUS | 0 refills | Status: DC

## 2018-07-25 NOTE — Patient Instructions (Signed)
Preventive Care 40-64 Years, Female Preventive care refers to lifestyle choices and visits with your health care provider that can promote health and wellness. What does preventive care include?  A yearly physical exam. This is also called an annual well check.  Dental exams once or twice a year.  Routine eye exams. Ask your health care provider how often you should have your eyes checked.  Personal lifestyle choices, including: ? Daily care of your teeth and gums. ? Regular physical activity. ? Eating a healthy diet. ? Avoiding tobacco and drug use. ? Limiting alcohol use. ? Practicing safe sex. ? Taking low-dose aspirin daily starting at age 58. ? Taking vitamin and mineral supplements as recommended by your health care provider. What happens during an annual well check? The services and screenings done by your health care provider during your annual well check will depend on your age, overall health, lifestyle risk factors, and family history of disease. Counseling Your health care provider may ask you questions about your:  Alcohol use.  Tobacco use.  Drug use.  Emotional well-being.  Home and relationship well-being.  Sexual activity.  Eating habits.  Work and work Statistician.  Method of birth control.  Menstrual cycle.  Pregnancy history.  Screening You may have the following tests or measurements:  Height, weight, and BMI.  Blood pressure.  Lipid and cholesterol levels. These may be checked every 5 years, or more frequently if you are over 81 years old.  Skin check.  Lung cancer screening. You may have this screening every year starting at age 78 if you have a 30-pack-year history of smoking and currently smoke or have quit within the past 15 years.  Fecal occult blood test (FOBT) of the stool. You may have this test every year starting at age 65.  Flexible sigmoidoscopy or colonoscopy. You may have a sigmoidoscopy every 5 years or a colonoscopy  every 10 years starting at age 30.  Hepatitis C blood test.  Hepatitis B blood test.  Sexually transmitted disease (STD) testing.  Diabetes screening. This is done by checking your blood sugar (glucose) after you have not eaten for a while (fasting). You may have this done every 1-3 years.  Mammogram. This may be done every 1-2 years. Talk to your health care provider about when you should start having regular mammograms. This may depend on whether you have a family history of breast cancer.  BRCA-related cancer screening. This may be done if you have a family history of breast, ovarian, tubal, or peritoneal cancers.  Pelvic exam and Pap test. This may be done every 3 years starting at age 80. Starting at age 36, this may be done every 5 years if you have a Pap test in combination with an HPV test.  Bone density scan. This is done to screen for osteoporosis. You may have this scan if you are at high risk for osteoporosis.  Discuss your test results, treatment options, and if necessary, the need for more tests with your health care provider. Vaccines Your health care provider may recommend certain vaccines, such as:  Influenza vaccine. This is recommended every year.  Tetanus, diphtheria, and acellular pertussis (Tdap, Td) vaccine. You may need a Td booster every 10 years.  Varicella vaccine. You may need this if you have not been vaccinated.  Zoster vaccine. You may need this after age 5.  Measles, mumps, and rubella (MMR) vaccine. You may need at least one dose of MMR if you were born in  1958 or later. You may also need a second dose.  Pneumococcal 13-valent conjugate (PCV13) vaccine. You may need this if you have certain conditions and were not previously vaccinated.  Pneumococcal polysaccharide (PPSV23) vaccine. You may need one or two doses if you smoke cigarettes or if you have certain conditions.  Meningococcal vaccine. You may need this if you have certain  conditions.  Hepatitis A vaccine. You may need this if you have certain conditions or if you travel or work in places where you may be exposed to hepatitis A.  Hepatitis B vaccine. You may need this if you have certain conditions or if you travel or work in places where you may be exposed to hepatitis B.  Haemophilus influenzae type b (Hib) vaccine. You may need this if you have certain conditions.  Talk to your health care provider about which screenings and vaccines you need and how often you need them. This information is not intended to replace advice given to you by your health care provider. Make sure you discuss any questions you have with your health care provider. Document Released: 09/30/2015 Document Revised: 05/23/2016 Document Reviewed: 07/05/2015 Elsevier Interactive Patient Education  2018 Elsevier Inc.  

## 2018-07-25 NOTE — Progress Notes (Signed)
Subjective:     Sara Martinez is a 62 y.o. female and is here for a comprehensive physical exam. The patient reports no problems.  Social History   Socioeconomic History  . Marital status: Married    Spouse name: Not on file  . Number of children: 1  . Years of education: Not on file  . Highest education level: Not on file  Occupational History  . Occupation: nurse-- Marine scientist  Social Needs  . Financial resource strain: Not on file  . Food insecurity:    Worry: Not on file    Inability: Not on file  . Transportation needs:    Medical: Not on file    Non-medical: Not on file  Tobacco Use  . Smoking status: Never Smoker  . Smokeless tobacco: Never Used  Substance and Sexual Activity  . Alcohol use: Yes    Alcohol/week: 4.0 - 5.0 standard drinks    Types: 4 - 5 Glasses of wine per week  . Drug use: No  . Sexual activity: Yes    Partners: Male  Lifestyle  . Physical activity:    Days per week: Not on file    Minutes per session: Not on file  . Stress: Not on file  Relationships  . Social connections:    Talks on phone: Not on file    Gets together: Not on file    Attends religious service: Not on file    Active member of club or organization: Not on file    Attends meetings of clubs or organizations: Not on file    Relationship status: Not on file  . Intimate partner violence:    Fear of current or ex partner: Not on file    Emotionally abused: Not on file    Physically abused: Not on file    Forced sexual activity: Not on file  Other Topics Concern  . Not on file  Social History Narrative   Exercise-- walk for 30 minutes every morning   Health Maintenance  Topic Date Due  . HIV Screening  07/23/2028 (Originally 12/08/1970)  . PAP SMEAR  06/12/2019  . MAMMOGRAM  07/19/2019  . COLONOSCOPY  06/16/2020  . TETANUS/TDAP  06/11/2026  . INFLUENZA VACCINE  Completed  . 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 She does not have any pertinent problems on file. She  has a past surgical history that includes Combined hysterectomy abdominal w/ MMK / Burch procedure (2000) and Abdominal hysterectomy. Her family history includes Arthritis in her father and mother; Cancer in her father and paternal aunt; Cancer (age of onset: 99) in her mother; Hypertension in her mother; Ovarian cancer in her mother; Prostate cancer in her father. She  reports that she has never smoked. She has never used smokeless tobacco. She reports that she drinks about 4.0 - 5.0 standard drinks of alcohol per week. She reports that she does not use drugs. She has a current medication list which includes the following prescription(s): metronidazole. No current outpatient medications on file prior to visit.   No current facility-administered medications on file prior to visit.    She has No Known Allergies..  Review of Systems Review of Systems  Constitutional: Negative for activity change, appetite change and fatigue.  HENT: Negative for hearing loss, congestion, tinnitus and ear  discharge.  dentist q24m Eyes: Negative for visual disturbance (see optho q1y -- vision corrected to 20/20 with glasses).  Respiratory: Negative for cough, chest tightness and shortness of breath.   Cardiovascular: Negative for chest pain, palpitations and leg swelling.  Gastrointestinal: Negative for abdominal pain, diarrhea, constipation and abdominal distention.  Genitourinary: Negative for urgency, frequency, decreased urine volume and difficulty urinating.  Musculoskeletal: Negative for back pain, arthralgias and gait problem.  Skin: Negative for color change, pallor and rash.  Neurological: Negative for dizziness, light-headedness, numbness and headaches.  Hematological: Negative for adenopathy. Does  not bruise/bleed easily.  Psychiatric/Behavioral: Negative for suicidal ideas, confusion, sleep disturbance, self-injury, dysphoric mood, decreased concentration and agitation.       Objective:    BP 127/64 (BP Location: Left Arm, Cuff Size: Normal)   Pulse 63   Temp 98.2 F (36.8 C) (Oral)   Resp 16   Ht 5' 6.8" (1.697 m)   Wt 152 lb 3.2 oz (69 kg)   SpO2 100%   BMI 23.98 kg/m  General appearance: alert, cooperative, appears stated age and no distress Head: Normocephalic, without obvious abnormality, atraumatic Eyes: conjunctivae/corneas clear. PERRL, EOM's intact. Fundi benign. Ears: normal TM's and external ear canals both ears Nose: Nares normal. Septum midline. Mucosa normal. No drainage or sinus tenderness. Throat: lips, mucosa, and tongue normal; teeth and gums normal Neck: no adenopathy, no carotid bruit, no JVD, supple, symmetrical, trachea midline and thyroid not enlarged, symmetric, no tenderness/mass/nodules Back: symmetric, no curvature. ROM normal. No CVA tenderness. Lungs: clear to auscultation bilaterally Breasts: normal appearance, no masses or tenderness Heart: regular rate and rhythm, S1, S2 normal, no murmur, click, rub or gallop Abdomen: soft, non-tender; bowel sounds normal; no masses,  no organomegaly Pelvic: extremities normal, atraumatic, no cyanosis or edema Pulses: 2+ and symmetric Skin: Skin color, texture, turgor normal. No rashes or lesions Lymph nodes: Cervical, supraclavicular, and axillary nodes normal. Neurologic: Alert and oriented X 3, normal strength and tone. Normal symmetric reflexes. Normal coordination and gait    Assessment:    Healthy female exam.     Plan:    ghm utd Check labs  See After Visit Summary for Counseling Recommendations    1. Preventative health care See above ghm utd Check labs  - CBC with Differential/Platelet - Comprehensive metabolic panel - Lipid panel - TSH  2. Estrogen deficiency  - DG Bone  Density; Future  3. Breast cancer screening  - MM DIGITAL SCREENING BILATERAL; Future  4. Need for hepatitis C screening test  - Hepatitis C antibody  5. Rosacea Restart metrogel - metroNIDAZOLE (METROGEL) 1 % gel; Apply topically daily.  Dispense: 45 g; Refill: 0

## 2018-07-26 LAB — CBC WITH DIFFERENTIAL/PLATELET
BASOS ABS: 39 {cells}/uL (ref 0–200)
Basophils Relative: 0.5 %
EOS ABS: 69 {cells}/uL (ref 15–500)
Eosinophils Relative: 0.9 %
HCT: 40.6 % (ref 35.0–45.0)
Hemoglobin: 13.8 g/dL (ref 11.7–15.5)
Lymphs Abs: 1956 cells/uL (ref 850–3900)
MCH: 29 pg (ref 27.0–33.0)
MCHC: 34 g/dL (ref 32.0–36.0)
MCV: 85.3 fL (ref 80.0–100.0)
MONOS PCT: 9.2 %
NEUTROS PCT: 64 %
Neutro Abs: 4928 cells/uL (ref 1500–7800)
Platelets: 128 10*3/uL — ABNORMAL LOW (ref 140–400)
RBC: 4.76 10*6/uL (ref 3.80–5.10)
RDW: 12.9 % (ref 11.0–15.0)
TOTAL LYMPHOCYTE: 25.4 %
WBC: 7.7 10*3/uL (ref 3.8–10.8)
WBCMIX: 708 {cells}/uL (ref 200–950)

## 2018-07-26 LAB — COMPREHENSIVE METABOLIC PANEL
AG Ratio: 1.7 (calc) (ref 1.0–2.5)
ALT: 13 U/L (ref 6–29)
AST: 17 U/L (ref 10–35)
Albumin: 4.2 g/dL (ref 3.6–5.1)
Alkaline phosphatase (APISO): 63 U/L (ref 33–130)
BUN: 10 mg/dL (ref 7–25)
CALCIUM: 9.1 mg/dL (ref 8.6–10.4)
CO2: 27 mmol/L (ref 20–32)
Chloride: 105 mmol/L (ref 98–110)
Creat: 0.89 mg/dL (ref 0.50–0.99)
GLUCOSE: 78 mg/dL (ref 65–99)
Globulin: 2.5 g/dL (calc) (ref 1.9–3.7)
Potassium: 4.3 mmol/L (ref 3.5–5.3)
SODIUM: 141 mmol/L (ref 135–146)
TOTAL PROTEIN: 6.7 g/dL (ref 6.1–8.1)
Total Bilirubin: 0.8 mg/dL (ref 0.2–1.2)

## 2018-07-26 LAB — LIPID PANEL
Cholesterol: 192 mg/dL (ref <200)
HDL: 83 mg/dL (ref >50)
LDL CHOLESTEROL (CALC): 95 mg/dL
NON-HDL CHOLESTEROL (CALC): 109 mg/dL (ref <130)
TRIGLYCERIDES: 49 mg/dL (ref <150)
Total CHOL/HDL Ratio: 2.3 (calc) (ref <5.0)

## 2018-07-26 LAB — TSH: TSH: 1 m[IU]/L (ref 0.40–4.50)

## 2018-07-26 LAB — HEPATITIS C ANTIBODY
HEP C AB: NONREACTIVE
SIGNAL TO CUT-OFF: 0.01 (ref <1.00)

## 2018-07-28 ENCOUNTER — Encounter (HOSPITAL_BASED_OUTPATIENT_CLINIC_OR_DEPARTMENT_OTHER): Payer: Self-pay

## 2018-07-28 ENCOUNTER — Ambulatory Visit (HOSPITAL_BASED_OUTPATIENT_CLINIC_OR_DEPARTMENT_OTHER)
Admission: RE | Admit: 2018-07-28 | Discharge: 2018-07-28 | Disposition: A | Discharge status: Home / Self Care | Payer: 59 | Source: Ambulatory Visit | Attending: Family Medicine | Admitting: Family Medicine

## 2018-07-28 ENCOUNTER — Other Ambulatory Visit: Payer: Self-pay | Admitting: Family Medicine

## 2018-07-28 DIAGNOSIS — Z1239 Encounter for other screening for malignant neoplasm of breast: Secondary | ICD-10-CM

## 2018-07-28 DIAGNOSIS — E2839 Other primary ovarian failure: Secondary | ICD-10-CM

## 2018-12-22 ENCOUNTER — Telehealth: Payer: 59 | Admitting: Family

## 2018-12-22 DIAGNOSIS — R300 Dysuria: Secondary | ICD-10-CM | POA: Diagnosis not present

## 2018-12-22 MED ORDER — SULFAMETHOXAZOLE-TRIMETHOPRIM 800-160 MG PO TABS: 1.0000 | ORAL_TABLET | Freq: Two times a day (BID) | ORAL | 0 refills | Status: DC

## 2018-12-22 NOTE — Progress Notes (Signed)

## 2019-04-28 ENCOUNTER — Other Ambulatory Visit: Payer: Self-pay

## 2019-04-28 ENCOUNTER — Encounter: Payer: Self-pay | Admitting: Family Medicine

## 2019-04-28 ENCOUNTER — Ambulatory Visit: Payer: 59 | Admitting: Family Medicine

## 2019-04-28 VITALS — BP 146/86 | HR 75 | Temp 98.3°F | Resp 18 | Ht 66.8 in | Wt 162.2 lb

## 2019-04-28 DIAGNOSIS — H6123 Impacted cerumen, bilateral: Secondary | ICD-10-CM

## 2019-04-28 DIAGNOSIS — H66002 Acute suppurative otitis media without spontaneous rupture of ear drum, left ear: Secondary | ICD-10-CM

## 2019-04-28 MED ORDER — CEFDINIR 300 MG PO CAPS: 300.0000 mg | ORAL_CAPSULE | Freq: Two times a day (BID) | ORAL | 0 refills | Status: DC

## 2019-04-28 NOTE — Progress Notes (Signed)
New Patient Office Visit  Subjective:  Patient ID: Sara Martinez, female    DOB: [birth year]  Age: 63 y.o. MRN: [id number]  CC:  Chief Complaint  Patient presents with  . Hearing Loss    both ears, x2 weeks, pt states feeling head congestion, no cough, no runny nose    HPI Sara Martinez presents for ears feeling blocked   Past Medical History:  Diagnosis Date  . Allergy    SEASONAL  . Thrombocytopenia, primary (congenital) ([place]) 01/30/2012  . Torus palatinus     Past Surgical History:  Procedure Laterality Date  . ABDOMINAL HYSTERECTOMY     patient report 2000 she had and hysterectomy  . COMBINED HYSTERECTOMY ABDOMINAL W/ MMK / BURCH PROCEDURE  2000   CYSTOCELE RECTOCELE    Family History  Problem Relation Age of Onset  . Ovarian cancer Mother   . Hypertension Mother   . Cancer Mother 59       ovarian   . Arthritis Mother   . Prostate cancer Father   . Cancer Father        prostate  . Arthritis Father   . Cancer Paternal Aunt        breast  . Colon cancer Neg Hx   . Stomach cancer Neg Hx   . Esophageal cancer Neg Hx   . Rectal cancer Neg Hx     Social History   Socioeconomic History  . Marital status: Married    Spouse name: Not on file  . Number of children: 1  . Years of education: Not on file  . Highest education level: Not on file  Occupational History  . Occupation: nurse-- Marine scientist  Social Needs  . Financial resource strain: Not on file  . Food insecurity    Worry: Not on file    Inability: Not on file  . Transportation needs    Medical: Not on file    Non-medical: Not on file  Tobacco Use  . Smoking status: Never Smoker  . Smokeless tobacco: Never Used  Substance and Sexual Activity  . Alcohol use: Yes    Alcohol/week: 4.0 - 5.0 standard drinks    Types: 4 - 5 Glasses of wine per week  . Drug use: No  . Sexual activity: Yes    Partners: Male  Lifestyle  . Physical activity    Days per week: Not on file    Minutes per session:  Not on file  . Stress: Not on file  Relationships  . Social Herbalist on phone: Not on file    Gets together: Not on file    Attends religious service: Not on file    Active member of club or organization: Not on file    Attends meetings of clubs or organizations: Not on file    Relationship status: Not on file  . Intimate partner violence    Fear of current or ex partner: Not on file    Emotionally abused: Not on file    Physically abused: Not on file    Forced sexual activity: Not on file  Other Topics Concern  . Not on file  Social History Narrative   Exercise-- walk for 30 minutes every morning    ROS Review of Systems  Constitutional: Negative.  Negative for appetite change, diaphoresis, fatigue and unexpected weight change.  HENT: Positive for ear pain and hearing loss. Negative for ear discharge, sinus pressure, sinus pain, sneezing and sore throat.  Eyes: Negative for pain, redness and visual disturbance.  Respiratory: Negative for cough, chest tightness, shortness of breath and wheezing.   Cardiovascular: Negative for chest pain, palpitations and leg swelling.  Endocrine: Negative for cold intolerance, heat intolerance, polydipsia, polyphagia and polyuria.  Genitourinary: Negative for difficulty urinating, dysuria and frequency.  Neurological: Negative for dizziness, light-headedness, numbness and headaches.    Objective:   Today's Vitals: BP (!) 146/86 (BP Location: Left Arm, Patient Position: Sitting, Cuff Size: Normal)   Pulse 75   Temp 98.3 F (36.8 C) (Temporal)   Resp 18   Ht 5' 6.8" (1.697 m)   Wt 162 lb 3.2 oz (73.6 kg)   SpO2 100%   BMI 25.56 kg/m   Physical Exam Vitals signs and nursing note reviewed.  Constitutional:      Appearance: She is well-developed.  HENT:     Head: Normocephalic and atraumatic.     Right Ear: There is impacted cerumen.     Left Ear: There is impacted cerumen.     Ears:   Eyes:     Conjunctiva/sclera:  Conjunctivae normal.  Neck:     Musculoskeletal: Normal range of motion and neck supple.     Thyroid: No thyromegaly.     Vascular: No carotid bruit or JVD.  Cardiovascular:     Rate and Rhythm: Normal rate and regular rhythm.     Heart sounds: Normal heart sounds. No murmur.  Pulmonary:     Effort: Pulmonary effort is normal. No respiratory distress.     Breath sounds: Normal breath sounds. No wheezing or rales.  Chest:     Chest wall: No tenderness.  Neurological:     Mental Status: She is alert and oriented to person, place, and time.     Assessment & Plan:   Problem List Items Addressed This Visit    None    Visit Diagnoses    Non-recurrent acute suppurative otitis media of left ear without spontaneous rupture of tympanic membrane    -  Primary   Relevant Medications   cefdinir (OMNICEF) 300 MG capsule   Bilateral impacted cerumen       Relevant Medications   cefdinir (OMNICEF) 300 MG capsule    pt will con't to use drops for wax and return next week if needed for irrigation   Outpatient Encounter Medications as of 04/28/2019  Medication Sig  . metroNIDAZOLE (METROGEL) 1 % gel Apply topically daily.  . cefdinir (OMNICEF) 300 MG capsule Take 1 capsule (300 mg total) by mouth 2 (two) times daily.  . [DISCONTINUED] sulfamethoxazole-trimethoprim (BACTRIM DS,SEPTRA DS) 800-160 MG tablet Take 1 tablet by mouth 2 (two) times daily.   No facility-administered encounter medications on file as of 04/28/2019.     Follow-up: Return in about 3 months (around 07/29/2019), or if symptoms worsen or fail to improve, for annual exam, fasting.   Ann Held, DO

## 2019-04-28 NOTE — Progress Notes (Signed)
hearing

## 2019-04-28 NOTE — Patient Instructions (Signed)
Earwax Buildup, Adult The ears produce a substance called earwax that helps keep bacteria out of the ear and protects the skin in the ear canal. Occasionally, earwax can build up in the ear and cause discomfort or hearing loss. What increases the risk? This condition is more likely to develop in people who:  Are female.  Are elderly.  Naturally produce more earwax.  Clean their ears often with cotton swabs.  Use earplugs often.  Use in-ear headphones often.  Wear hearing aids.  Have narrow ear canals.  Have earwax that is overly thick or sticky.  Have eczema.  Are dehydrated.  Have excess hair in the ear canal. What are the signs or symptoms? Symptoms of this condition include:  Reduced or muffled hearing.  A feeling of fullness in the ear or feeling that the ear is plugged.  Fluid coming from the ear.  Ear pain.  Ear itch.  Ringing in the ear.  Coughing.  An obvious piece of earwax that can be seen inside the ear canal. How is this diagnosed? This condition may be diagnosed based on:  Your symptoms.  Your medical history.  An ear exam. During the exam, your health care provider will look into your ear with an instrument called an otoscope. You may have tests, including a hearing test. How is this treated? This condition may be treated by:  Using ear drops to soften the earwax.  Having the earwax removed by a health care provider. The health care provider may: ? Flush the ear with water. ? Use an instrument that has a loop on the end (curette). ? Use a suction device.  Surgery to remove the wax buildup. This may be done in severe cases. Follow these instructions at home:   Take over-the-counter and prescription medicines only as told by your health care provider.  Do not put any objects, including cotton swabs, into your ear. You can clean the opening of your ear canal with a washcloth or facial tissue.  Follow instructions from your health care  provider about cleaning your ears. Do not over-clean your ears.  Drink enough fluid to keep your urine clear or pale yellow. This will help to thin the earwax.  Keep all follow-up visits as told by your health care provider. If earwax builds up in your ears often or if you use hearing aids, consider seeing your health care provider for routine, preventive ear cleanings. Ask your health care provider how often you should schedule your cleanings.  If you have hearing aids, clean them according to instructions from the manufacturer and your health care provider. Contact a health care provider if:  You have ear pain.  You develop a fever.  You have blood, pus, or other fluid coming from your ear.  You have hearing loss.  You have ringing in your ears that does not go away.  Your symptoms do not improve with treatment.  You feel like the room is spinning (vertigo). Summary  Earwax can build up in the ear and cause discomfort or hearing loss.  The most common symptoms of this condition include reduced or muffled hearing and a feeling of fullness in the ear or feeling that the ear is plugged.  This condition may be diagnosed based on your symptoms, your medical history, and an ear exam.  This condition may be treated by using ear drops to soften the earwax or by having the earwax removed by a health care provider.  Do not put any   objects, including cotton swabs, into your ear. You can clean the opening of your ear canal with a washcloth or facial tissue. This information is not intended to replace advice given to you by your health care provider. Make sure you discuss any questions you have with your health care provider. Document Released: 10/11/2004 Document Revised: 08/16/2017 Document Reviewed: 11/14/2016 Elsevier Patient Education  2020 Else[employer] Inc.  

## 2019-05-08 ENCOUNTER — Ambulatory Visit: Payer: 59 | Admitting: Family Medicine

## 2020-04-08 DIAGNOSIS — R69 Illness, unspecified: Secondary | ICD-10-CM | POA: Diagnosis not present

## 2020-06-01 ENCOUNTER — Other Ambulatory Visit (HOSPITAL_BASED_OUTPATIENT_CLINIC_OR_DEPARTMENT_OTHER): Payer: Self-pay | Admitting: Family Medicine

## 2020-06-01 DIAGNOSIS — Z1231 Encounter for screening mammogram for malignant neoplasm of breast: Secondary | ICD-10-CM

## 2020-06-06 ENCOUNTER — Ambulatory Visit (HOSPITAL_BASED_OUTPATIENT_CLINIC_OR_DEPARTMENT_OTHER)
Admission: RE | Admit: 2020-06-06 | Discharge: 2020-06-06 | Disposition: A | Discharge status: Home / Self Care | Payer: Medicare HMO | Source: Ambulatory Visit | Attending: Family Medicine | Admitting: Family Medicine

## 2020-06-06 ENCOUNTER — Other Ambulatory Visit: Payer: Self-pay

## 2020-06-06 ENCOUNTER — Encounter (HOSPITAL_BASED_OUTPATIENT_CLINIC_OR_DEPARTMENT_OTHER): Payer: Self-pay

## 2020-06-06 DIAGNOSIS — Z1231 Encounter for screening mammogram for malignant neoplasm of breast: Secondary | ICD-10-CM | POA: Diagnosis not present

## 2020-06-16 ENCOUNTER — Other Ambulatory Visit: Payer: Self-pay | Admitting: Family Medicine

## 2020-06-16 ENCOUNTER — Telehealth: Payer: Self-pay | Admitting: Family Medicine

## 2020-06-16 DIAGNOSIS — Z0000 Encounter for general adult medical examination without abnormal findings: Secondary | ICD-10-CM

## 2020-06-16 NOTE — Telephone Encounter (Signed)
Patient is requesting Cpe ordered prior to Cpe with provider. 08/04/2020 @ 2:20PM  Please Advise

## 2020-06-16 NOTE — Telephone Encounter (Signed)
If it is her first year on medicare ---- it will be a welcome to medicare cpe--- need ekg , vision , hearing  And I believe we can only do cmp, lipid

## 2020-06-16 NOTE — Telephone Encounter (Signed)
Normal CPE labs? Please advise

## 2020-06-22 NOTE — Telephone Encounter (Signed)
Pt called. LVM. Labs ordered. Need to verify if this is her first year on medicare and needs to set up a lab appointment

## 2020-06-27 DIAGNOSIS — R69 Illness, unspecified: Secondary | ICD-10-CM | POA: Diagnosis not present

## 2020-07-04 DIAGNOSIS — R69 Illness, unspecified: Secondary | ICD-10-CM | POA: Diagnosis not present

## 2020-07-21 NOTE — Addendum Note (Signed)
Addended by: Kelle Darting A on: 07/21/2020 03:53 PM   Modules accepted: Orders

## 2020-07-21 NOTE — Addendum Note (Signed)
Addended by: Kelle Darting A on: 07/21/2020 03:52 PM   Modules accepted: Orders

## 2020-07-22 ENCOUNTER — Encounter: Payer: Medicare HMO | Admitting: Family Medicine

## 2020-07-25 NOTE — Addendum Note (Signed)
Addended by: Kelle Darting A on: 07/25/2020 03:39 PM   Modules accepted: Orders

## 2020-07-25 NOTE — Addendum Note (Signed)
Addended by: Kelle Darting A on: 07/25/2020 03:42 PM   Modules accepted: Orders

## 2020-07-29 ENCOUNTER — Other Ambulatory Visit: Payer: Self-pay

## 2020-07-29 ENCOUNTER — Other Ambulatory Visit (INDEPENDENT_AMBULATORY_CARE_PROVIDER_SITE_OTHER): Payer: Medicare HMO

## 2020-07-29 DIAGNOSIS — Z0000 Encounter for general adult medical examination without abnormal findings: Secondary | ICD-10-CM

## 2020-07-29 LAB — COMPREHENSIVE METABOLIC PANEL
ALT: 11 U/L (ref 0–35)
AST: 17 U/L (ref 0–37)
Albumin: 4.1 g/dL (ref 3.5–5.2)
Alkaline Phosphatase: 64 U/L (ref 39–117)
BUN: 13 mg/dL (ref 6–23)
CO2: 29 mEq/L (ref 19–32)
Calcium: 8.8 mg/dL (ref 8.4–10.5)
Chloride: 106 mEq/L (ref 96–112)
Creatinine, Ser: 0.77 mg/dL (ref 0.40–1.20)
GFR: 80.83 mL/min (ref >60.00)
Glucose, Bld: 96 mg/dL (ref 70–99)
Potassium: 4.3 mEq/L (ref 3.5–5.1)
Sodium: 140 mEq/L (ref 135–145)
Total Bilirubin: 0.6 mg/dL (ref 0.2–1.2)
Total Protein: 6.6 g/dL (ref 6.0–8.3)

## 2020-07-29 LAB — LIPID PANEL
Cholesterol: 191 mg/dL (ref 0–200)
HDL: 88.2 mg/dL (ref >39.00)
LDL Cholesterol: 95 mg/dL (ref 0–99)
NonHDL: 102.92
Total CHOL/HDL Ratio: 2
Triglycerides: 42 mg/dL (ref 0.0–149.0)
VLDL: 8.4 mg/dL (ref 0.0–40.0)

## 2020-08-04 ENCOUNTER — Other Ambulatory Visit: Payer: Self-pay

## 2020-08-04 ENCOUNTER — Encounter: Payer: Self-pay | Admitting: Family Medicine

## 2020-08-04 ENCOUNTER — Ambulatory Visit (INDEPENDENT_AMBULATORY_CARE_PROVIDER_SITE_OTHER): Payer: Medicare HMO | Admitting: Family Medicine

## 2020-08-04 VITALS — BP 132/72 | HR 79 | Temp 98.8°F | Resp 18 | Ht 66.8 in | Wt 149.4 lb

## 2020-08-04 DIAGNOSIS — Z0000 Encounter for general adult medical examination without abnormal findings: Secondary | ICD-10-CM | POA: Diagnosis not present

## 2020-08-04 DIAGNOSIS — Z86010 Personal history of colonic polyps: Secondary | ICD-10-CM

## 2020-08-04 NOTE — Patient Instructions (Signed)
Preventive Care 65 Years and Older, Female Preventive care refers to lifestyle choices and visits with your health care provider that can promote health and wellness. This includes:  A yearly physical exam. This is also called an annual well check.  Regular dental and eye exams.  Immunizations.  Screening for certain conditions.  Healthy lifestyle choices, such as diet and exercise. What can I expect for my preventive care visit? Physical exam Your health care provider will check:  Height and weight. These may be used to calculate body mass index (BMI), which is a measurement that tells if you are at a healthy weight.  Heart rate and blood pressure.  Your skin for abnormal spots. Counseling Your health care provider may ask you questions about:  Alcohol, tobacco, and drug use.  Emotional well-being.  Home and relationship well-being.  Sexual activity.  Eating habits.  History of falls.  Memory and ability to understand (cognition).  Work and work Statistician.  Pregnancy and menstrual history. What immunizations do I need?  Influenza (flu) vaccine  This is recommended every year. Tetanus, diphtheria, and pertussis (Tdap) vaccine  You may need a Td booster every 10 years. Varicella (chickenpox) vaccine  You may need this vaccine if you have not already been vaccinated. Zoster (shingles) vaccine  You may need this after age 33. Pneumococcal conjugate (PCV13) vaccine  One dose is recommended after age 33. Pneumococcal polysaccharide (PPSV23) vaccine  One dose is recommended after age 72. Measles, mumps, and rubella (MMR) vaccine  You may need at least one dose of MMR if you were born in 1957 or later. You may also need a second dose. Meningococcal conjugate (MenACWY) vaccine  You may need this if you have certain conditions. Hepatitis A vaccine  You may need this if you have certain conditions or if you travel or work in places where you may be exposed  to hepatitis A. Hepatitis B vaccine  You may need this if you have certain conditions or if you travel or work in places where you may be exposed to hepatitis B. Haemophilus influenzae type b (Hib) vaccine  You may need this if you have certain conditions. You may receive vaccines as individual doses or as more than one vaccine together in one shot (combination vaccines). Talk with your health care provider about the risks and benefits of combination vaccines. What tests do I need? Blood tests  Lipid and cholesterol levels. These may be checked every 5 years, or more frequently depending on your overall health.  Hepatitis C test.  Hepatitis B test. Screening  Lung cancer screening. You may have this screening every year starting at age 39 if you have a 30-pack-year history of smoking and currently smoke or have quit within the past 15 years.  Colorectal cancer screening. All adults should have this screening starting at age 36 and continuing until age 15. Your health care provider may recommend screening at age 23 if you are at increased risk. You will have tests every 1-10 years, depending on your results and the type of screening test.  Diabetes screening. This is done by checking your blood sugar (glucose) after you have not eaten for a while (fasting). You may have this done every 1-3 years.  Mammogram. This may be done every 1-2 years. Talk with your health care provider about how often you should have regular mammograms.  BRCA-related cancer screening. This may be done if you have a family history of breast, ovarian, tubal, or peritoneal cancers.  Other tests  Sexually transmitted disease (STD) testing.  Bone density scan. This is done to screen for osteoporosis. You may have this done starting at age 44. Follow these instructions at home: Eating and drinking  Eat a diet that includes fresh fruits and vegetables, whole grains, lean protein, and low-fat dairy products. Limit  your intake of foods with high amounts of sugar, saturated fats, and salt.  Take vitamin and mineral supplements as recommended by your health care provider.  Do not drink alcohol if your health care provider tells you not to drink.  If you drink alcohol: ? Limit how much you have to 0-1 drink a day. ? Be aware of how much alcohol is in your drink. In the [place] one drink equals one 12 oz bottle of beer (355 mL), one 5 oz glass of wine (148 mL), or one 1 oz glass of hard liquor (44 mL). Lifestyle  Take daily care of your teeth and gums.  Stay active. Exercise for at least 30 minutes on 5 or more days each week.  Do not use any products that contain nicotine or tobacco, such as cigarettes, e-cigarettes, and chewing tobacco. If you need help quitting, ask your health care provider.  If you are sexually active, practice safe sex. Use a condom or other form of protection in order to prevent STIs (sexually transmitted infections).  Talk with your health care provider about taking a low-dose aspirin or statin. What's next?  Go to your health care provider once a year for a well check visit.  Ask your health care provider how often you should have your eyes and teeth checked.  Stay up to date on all vaccines. This information is not intended to replace advice given to you by your health care provider. Make sure you discuss any questions you have with your health care provider. Document Revised: 08/28/2018 Document Reviewed: 08/28/2018 Elsevier Patient Education  2020 [employer].

## 2020-08-04 NOTE — Progress Notes (Signed)
Subjective:    Sara Martinez is a 64 y.o. female who presents for a Welcome to Medicare exam.   Review of Systems  Review of Systems  Constitutional: Negative for activity change, appetite change and fatigue.  HENT: Negative for hearing loss, congestion, tinnitus and ear discharge.   Eyes: Negative for visual disturbance (see optho q1y -- vision corrected to 20/20 with glasses).  Respiratory: Negative for cough, chest tightness and shortness of breath.   Cardiovascular: Negative for chest pain, palpitations and leg swelling.  Gastrointestinal: Negative for abdominal pain, diarrhea, constipation and abdominal distention.  Genitourinary: Negative for urgency, frequency, decreased urine volume and difficulty urinating.  Musculoskeletal: Negative for back pain, arthralgias and gait problem.  Skin: Negative for color change, pallor and rash.  Neurological: Negative for dizziness, light-headedness, numbness and headaches.  Hematological: Negative for adenopathy. Does not bruise/bleed easily.  Psychiatric/Behavioral: Negative for suicidal ideas, confusion, sleep disturbance, self-injury, dysphoric mood, decreased concentration and agitation.  Pt is able to read and write and can do all ADLs No risk for falling No abuse/ violence in home           Objective:    Today's Vitals   08/04/20 1426  BP: 132/72  Pulse: 79  Resp: 18  Temp: 98.8 F (37.1 C)  TempSrc: Oral  SpO2: 99%  Weight: 149 lb 6.4 oz (67.8 kg)  Height: 5' 6.8" (1.697 m)  Body mass index is 23.54 kg/m.  Medications Outpatient Encounter Medications as of 08/04/2020  Medication Sig  . [DISCONTINUED] cefdinir (OMNICEF) 300 MG capsule Take 1 capsule (300 mg total) by mouth 2 (two) times daily. (Patient not taking: Reported on 08/04/2020)  . [DISCONTINUED] metroNIDAZOLE (METROGEL) 1 % gel Apply topically daily. (Patient not taking: Reported on 08/04/2020)   No facility-administered encounter medications on file  as of 08/04/2020.     History: Past Medical History:  Diagnosis Date  . Allergy    SEASONAL  . 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 Torus palatinus    Past Surgical History:  Procedure Laterality Date  . ABDOMINAL HYSTERECTOMY     patient report 2000 she had and hysterectomy  . COMBINED HYSTERECTOMY ABDOMINAL W/ MMK / BURCH PROCEDURE  2000   CYSTOCELE RECTOCELE    Family History  Problem Relation Age of Onset  . Ovarian cancer Mother   . Hypertension Mother   . Cancer Mother 44       ovarian   . Arthritis Mother   . Prostate cancer Father   . Cancer Father        prostate  . Arthritis Father   . Cancer Paternal Aunt        breast  . Colon cancer Neg Hx   . Stomach cancer Neg Hx   . Esophageal cancer Neg Hx   . Rectal cancer Neg Hx    Social History   Occupational History  . Occupation: Marine scientist-- Patent examiner: [place]  Tobacco Use  . Smoking status: Never Smoker  . Smokeless tobacco: Never Used  Substance and Sexual Activity  . Alcohol use: Yes    Alcohol/week: 4.0 - 5.0 standard drinks    Types: 4 - 5 Glasses of wine per week  . Drug use: No  . Sexual activity: Yes    Partners: Male    Tobacco Counseling Counseling given: No   Immunizations and Health Maintenance Immunization History  Administered Date(s) Administered  . Influenza, Seasonal, Injecte, Preservative Fre 07/04/2020  .  Influenza-Unspecified 06/18/2017, 06/29/2018  . PFIZER SARS-COV-2 Vaccination 09/12/2019, 10/02/2019, 06/14/2020  . PPD Test 10/14/2019  . Pneumococcal Polysaccharide-23 01/29/1997  . Tdap 01/29/1997, 06/11/2016  . Zoster Recombinat (Shingrix) 07/23/2017, 12/18/2017   Health Maintenance Due  Topic Date Due  . PNA vac Low Risk Adult (1 of 2 - PCV13) 12/09/2019  . COLONOSCOPY  06/16/2020  . DEXA SCAN  07/28/2020    Activities of Daily Living In your present state of health, do you have any difficulty performing the following activities:  08/04/2020  Hearing? N  Vision? N  Difficulty concentrating or making decisions? N  Walking or climbing stairs? N  Dressing or bathing? N  Doing errands, shopping? N  Some recent data might be hidden    Physical Exam   BP 132/72 (BP Location: Left Arm, Patient Position: Sitting, Cuff Size: Normal)   Pulse 79   Temp 98.8 F (37.1 C) (Oral)   Resp 18   Ht 5' 6.8" (1.697 m)   Wt 149 lb 6.4 oz (67.8 kg)   SpO2 99%   BMI 23.54 kg/m  General appearance: alert, cooperative, appears stated age and no distress Head: Normocephalic, without obvious abnormality, atraumatic Eyes: negative findings: lids and lashes normal, conjunctivae and sclerae normal and pupils equal, round, reactive to light and accomodation Ears: normal TM's and external ear canals both ears Neck: no adenopathy, no carotid bruit, no JVD, supple, symmetrical, trachea midline and thyroid not enlarged, symmetric, no tenderness/mass/nodules Back: symmetric, no curvature. ROM normal. No CVA tenderness. Lungs: clear to auscultation bilaterally Breasts: normal appearance, no masses or tenderness Heart: regular rate and rhythm, S1, S2 normal, no murmur, click, rub or gallop Abdomen: soft, non-tender; bowel sounds normal; no masses,  no organomegaly Pelvic: not indicated; post-menopausal, no abnormal Pap smears in past Extremities: extremities normal, atraumatic, no cyanosis or edema Pulses: 2+ and symmetric Skin: Skin color, texture, turgor normal. No rashes or lesions Lymph nodes: Cervical, supraclavicular, and axillary nodes normal. Neurologic: Alert and oriented X 3, normal strength and tone. Normal symmetric reflexes. Normal coordination and gait Advanced Directives: Does Patient Have a Medical Advance Directive?: Yes Type of Advance Directive: Healthcare Power of Attorney, Living will Does patient want to make changes to medical advance directive?: No - Patient declined    [id number]--[id number]  Assessment:    This is a routine  wellness examination for this patient .   Vision/Hearing screen  Hearing Screening   125Hz  250Hz  500Hz  1000Hz  2000Hz  3000Hz  4000Hz  6000Hz  8000Hz   Right ear:           Left ear:           Comments: Normal whisper    Visual Acuity Screening   Right eye Left eye Both eyes  Without correction:     With correction: 20/30 20/30 20/20     Dietary issues and exercise activities discussed:  Current Exercise Habits: Structured exercise class, Type of exercise: walking;strength training/weights;Other - see comments, Time (Minutes): 60, Frequency (Times/Week): 3, Weekly Exercise (Minutes/Week): 180, Intensity: Moderate, Exercise limited by: None identified  Goals   None    Depression Screen PHQ 2/9 Scores 08/04/2020 08/04/2020 07/25/2018 07/23/2017  PHQ - 2 Score 0 0 0 0  PHQ- 9 Score 0 - 0 -     Fall Risk Fall Risk  08/04/2020  Falls in the past year? 0  Number falls in past yr: 0  Injury with Fall? 0  Follow up -    Cognitive Function: MMSE - Mini Mental State Exam 08/04/2020  Orientation to time 5  Orientation to Place 5  Registration 3  Attention/ Calculation 5  Recall 3  Language- name 2 objects 2  Language- repeat 1  Language- follow 3 step command 3  Language- read & follow direction 1  Write a sentence 1  Copy design 1  Total score 30        Patient Care Team: Ann Held, DO as PCP - General (Family Medicine)     Plan:   ghm utd Labs reviewed with pt    Chemistry      Component Value Date/Time   NA 140 07/29/2020 0825   K 4.3 07/29/2020 0825   CL 106 07/29/2020 0825   CO2 29 07/29/2020 0825   BUN 13 07/29/2020 0825   CREATININE 0.77 07/29/2020 0825   CREATININE 0.89 07/25/2018 1602      Component Value Date/Time   CALCIUM 8.8 07/29/2020 0825   ALKPHOS 64 07/29/2020 0825   AST 17 07/29/2020 0825   ALT 11 07/29/2020 0825   BILITOT 0.6 07/29/2020 0825     Lab Results  Component Value Date   CHOL 191 07/29/2020   HDL 88.20 07/29/2020     LDLCALC 95 07/29/2020   TRIG 42.0 07/29/2020   CHOLHDL 2 07/29/2020    I have personally reviewed and noted the following in the patient's chart:   . Medical and social history . Use of alcohol, tobacco or illicit drugs  . Current medications and supplements . Functional ability and status . Nutritional status . Physical activity . Advanced directives . List of other physicians . Hospitalizations, surgeries, and ER visits in previous 12 months . Vitals . Screenings to include cognitive, depression, and falls . Referrals and appointments  In addition, I have reviewed and discussed with patient certain preventive protocols, quality metrics, and best practice recommendations. A written personalized care plan for preventive services as well as general preventive health recommendations were provided to patient.     [place], DO 08/04/2020

## 2020-08-04 NOTE — Assessment & Plan Note (Addendum)
ghm utd Check labs  See avs  Labs reviewed

## 2020-11-15 ENCOUNTER — Ambulatory Visit (AMBULATORY_SURGERY_CENTER): Payer: Self-pay

## 2020-11-15 ENCOUNTER — Other Ambulatory Visit: Payer: Self-pay

## 2020-11-15 VITALS — Ht 66.8 in | Wt 151.0 lb

## 2020-11-15 DIAGNOSIS — Z86010 Personal history of colonic polyps: Secondary | ICD-10-CM

## 2020-11-15 NOTE — Progress Notes (Signed)
No allergies to soy or egg Pt is not on blood thinners or diet pills Denies issues with sedation/intubation Denies atrial flutter/fib Denies constipation   Emmi instructions given to pt  Pt is aware of Covid safety and care partner requirements.  

## 2020-11-21 ENCOUNTER — Encounter: Payer: Self-pay | Admitting: Internal Medicine

## 2020-12-01 ENCOUNTER — Other Ambulatory Visit: Payer: Self-pay

## 2020-12-01 ENCOUNTER — Ambulatory Visit (AMBULATORY_SURGERY_CENTER): Payer: Medicare HMO | Admitting: Internal Medicine

## 2020-12-01 ENCOUNTER — Encounter: Payer: Self-pay | Admitting: Internal Medicine

## 2020-12-01 VITALS — BP 135/71 | HR 56 | Temp 97.0°F | Resp 21 | Ht 66.0 in | Wt 151.0 lb

## 2020-12-01 DIAGNOSIS — Z86010 Personal history of colonic polyps: Secondary | ICD-10-CM

## 2020-12-01 DIAGNOSIS — K635 Polyp of colon: Secondary | ICD-10-CM | POA: Diagnosis not present

## 2020-12-01 DIAGNOSIS — D123 Benign neoplasm of transverse colon: Secondary | ICD-10-CM

## 2020-12-01 DIAGNOSIS — D125 Benign neoplasm of sigmoid colon: Secondary | ICD-10-CM

## 2020-12-01 MED ORDER — SODIUM CHLORIDE 0.9 % IV SOLN: 500.0000 mL | Freq: Once | INTRAVENOUS | Status: DC

## 2020-12-01 NOTE — Patient Instructions (Addendum)
I found and removed 4 tiny polyps - one of them was not recovered but do not worry - it was removed.  I will let you know pathology results and when to have another routine colonoscopy by mail and/or My Chart.  I appreciate the opportunity to care for you. Gatha Mayer, MD, [facility]   HANDOUT ON POLYPS GIVEN TO YOU TODAY  AWAIT PATHOLOGY RESULTS ON POLYPS REMOVED     YOU HAD AN ENDOSCOPIC PROCEDURE TODAY AT [facility]:   Refer to the procedure report that was given to you for any specific questions about what was found during the examination.  If the procedure report does not answer your questions, please call your gastroenterologist to clarify.  If you requested that your care partner not be given the details of your procedure findings, then the procedure report has been included in a sealed envelope for you to review at your convenience later.  YOU SHOULD EXPECT: Some feelings of bloating in the abdomen. Passage of more gas than usual.  Walking can help get rid of the air that was put into your GI tract during the procedure and reduce the bloating. If you had a lower endoscopy (such as a colonoscopy or flexible sigmoidoscopy) you may notice spotting of blood in your stool or on the toilet paper. If you underwent a bowel prep for your procedure, you may not have a normal bowel movement for a few days.  Please Note:  You might notice some irritation and congestion in your nose or some drainage.  This is from the oxygen used during your procedure.  There is no need for concern and it should clear up in a day or so.  SYMPTOMS TO REPORT IMMEDIATELY:   Following lower endoscopy (colonoscopy or flexible sigmoidoscopy):  Excessive amounts of blood in the stool  Significant tenderness or worsening of abdominal pains  Swelling of the abdomen that is new, acute  Fever of 100F or higher    For urgent or emergent issues, a gastroenterologist can be reached at any hour by calling  [phone]. Do not use MyChart messaging for urgent concerns.    DIET:  We do recommend a small meal at first, but then you may proceed to your regular diet.  Drink plenty of fluids but you should avoid alcoholic beverages for 24 hours.  ACTIVITY:  You should plan to take it easy for the rest of today and you should NOT DRIVE or use heavy machinery until tomorrow (because of the sedation medicines used during the test).    FOLLOW UP: Our staff will call the number listed on your records 48-72 hours following your procedure to check on you and address any questions or concerns that you may have regarding the information given to you following your procedure. If we do not reach you, we will leave a message.  We will attempt to reach you two times.  During this call, we will ask if you have developed any symptoms of COVID 19. If you develop any symptoms (ie: fever, flu-like symptoms, shortness of breath, cough etc.) before then, please call [phone].  If you test positive for Covid 19 in the 2 weeks post procedure, please call and report this information to [place].    If any biopsies were taken you will be contacted by phone or by letter within the next 1-3 weeks.  Please call us at [phone] if you have not heard about the biopsies in 3 weeks.  SIGNATURES/CONFIDENTIALITY: You and/or your care partner have signed paperwork which will be entered into your electronic medical record.  These signatures attest to the fact that that the information above on your After Visit Summary has been reviewed and is understood.  Full responsibility of the confidentiality of this discharge information lies with you and/or your care-partner.

## 2020-12-01 NOTE — Op Note (Signed)
Sara Martinez Patient Name: Sara Martinez Procedure Date: 12/01/2020 9:57 AM MRN: [id number] Endoscopist: Gatha Mayer , MD Age: 65 Referring MD:  Date of Birth: 07/13/1956 Gender: Female Account #: [id number] Procedure:                Colonoscopy Indications:              Surveillance: Personal history of adenomatous                            polyps on last colonoscopy > 5 years ago Medicines:                Propofol per Anesthesia, Monitored Anesthesia Care Procedure:                Pre-Anesthesia Assessment:                           - Prior to the procedure, a History and Physical                            was performed, and patient medications and                            allergies were reviewed. The patient's tolerance of                            previous anesthesia was also reviewed. The risks                            and benefits of the procedure and the sedation                            options and risks were discussed with the patient.                            All questions were answered, and informed consent                            was obtained. Prior Anticoagulants: The patient has                            taken no previous anticoagulant or antiplatelet                            agents. ASA Grade Assessment: II - A patient with                            mild systemic disease. After reviewing the risks                            and benefits, the patient was deemed in                            satisfactory condition to undergo the procedure.  After obtaining informed consent, the colonoscope                            was passed under direct vision. Throughout the                            procedure, the patient's blood pressure, pulse, and                            oxygen saturations were monitored continuously. The                            Olympus CF-HQ190L ([id number]) Colonoscope was                             introduced through the anus and advanced to the the                            terminal ileum, with identification of the                            appendiceal orifice and IC valve. The colonoscopy                            was performed without difficulty. The patient                            tolerated the procedure well. The quality of the                            bowel preparation was good. The ileocecal valve,                            appendiceal orifice, and rectum were photographed.                            The bowel preparation used was Miralax via split                            dose instruction. Scope In: 10:08:52 AM Scope Out: 10:30:08 AM Scope Withdrawal Time: 0 hours 15 minutes 9 seconds  Total Procedure Duration: 0 hours 21 minutes 16 seconds  Findings:                 The perianal and digital rectal examinations were                            normal.                           Four sessile polyps were found in the sigmoid colon                            and descending colon. The polyps were diminutive in  size. These polyps were removed with a cold snare.                            Resection was complete, but the polyp tissue was                            only partially retrieved. Verification of patient                            identification for the specimen was done. Estimated                            blood loss was minimal.                           The exam was otherwise without abnormality on                            direct and retroflexion views. Complications:            No immediate complications. Estimated Blood Loss:     Estimated blood loss was minimal. Impression:               - Four diminutive polyps in the sigmoid colon and                            in the descending colon, removed with a cold snare.                            Complete resection. Partial retrieval.                           - The examination was  otherwise normal on direct                            and retroflexion views.                           - Personal history of colonic polyps.                           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 The signs and symptoms of potential                            delayed  complications were discussed with the                            patient. Return to normal activities tomorrow.                            Written discharge instructions were provided to the                            patient.                           - Resume previous diet.                           - Continue present medications.                           - Repeat colonoscopy is recommended for                            surveillance. The colonoscopy date will be                            determined after pathology results from today's                            exam become available for review. Gatha Mayer, MD 12/01/2020 10:37:07 AM This report has been signed electronically.

## 2020-12-01 NOTE — Progress Notes (Signed)
Called to room to assist during endoscopic procedure.  Patient ID and intended procedure confirmed with present staff. Received instructions for my participation in the procedure from the performing physician.  

## 2020-12-01 NOTE — Progress Notes (Signed)
To PACU, VSS. Report to [email or web address] 

## 2020-12-01 NOTE — Progress Notes (Signed)
VS- Sara Martinez  Pt's states no medical or surgical changes since previsit or office visit.  

## 2020-12-05 ENCOUNTER — Telehealth: Payer: Self-pay

## 2020-12-05 ENCOUNTER — Telehealth: Payer: Self-pay | Admitting: *Deleted

## 2020-12-05 NOTE — Telephone Encounter (Signed)
First attempt follow up call to pt, lm on vm 

## 2020-12-05 NOTE — Telephone Encounter (Signed)
No answer for post procedure call back. Left message for patient call with questions or concerns.

## 2020-12-09 ENCOUNTER — Encounter: Payer: Self-pay | Admitting: Internal Medicine

## 2021-04-13 DIAGNOSIS — H2513 Age-related nuclear cataract, bilateral: Secondary | ICD-10-CM | POA: Diagnosis not present

## 2021-07-05 DIAGNOSIS — H18513 Endothelial corneal dystrophy, bilateral: Secondary | ICD-10-CM | POA: Diagnosis not present

## 2021-07-05 DIAGNOSIS — H52203 Unspecified astigmatism, bilateral: Secondary | ICD-10-CM | POA: Diagnosis not present

## 2021-07-05 DIAGNOSIS — H524 Presbyopia: Secondary | ICD-10-CM | POA: Diagnosis not present

## 2021-07-05 DIAGNOSIS — H02831 Dermatochalasis of right upper eyelid: Secondary | ICD-10-CM | POA: Diagnosis not present

## 2021-07-05 DIAGNOSIS — H527 Unspecified disorder of refraction: Secondary | ICD-10-CM | POA: Diagnosis not present

## 2021-07-05 DIAGNOSIS — H02834 Dermatochalasis of left upper eyelid: Secondary | ICD-10-CM | POA: Diagnosis not present

## 2021-07-05 DIAGNOSIS — H35371 Puckering of macula, right eye: Secondary | ICD-10-CM | POA: Diagnosis not present

## 2021-07-05 DIAGNOSIS — H25813 Combined forms of age-related cataract, bilateral: Secondary | ICD-10-CM | POA: Diagnosis not present

## 2021-07-25 DIAGNOSIS — Z90710 Acquired absence of both cervix and uterus: Secondary | ICD-10-CM | POA: Diagnosis not present

## 2021-07-25 DIAGNOSIS — H25812 Combined forms of age-related cataract, left eye: Secondary | ICD-10-CM | POA: Insufficient documentation

## 2021-07-25 DIAGNOSIS — H02831 Dermatochalasis of right upper eyelid: Secondary | ICD-10-CM | POA: Diagnosis not present

## 2021-07-25 DIAGNOSIS — H25813 Combined forms of age-related cataract, bilateral: Secondary | ICD-10-CM | POA: Diagnosis not present

## 2021-07-25 DIAGNOSIS — H18513 Endothelial corneal dystrophy, bilateral: Secondary | ICD-10-CM | POA: Diagnosis not present

## 2021-07-25 DIAGNOSIS — H02834 Dermatochalasis of left upper eyelid: Secondary | ICD-10-CM | POA: Diagnosis not present

## 2021-07-25 DIAGNOSIS — H52222 Regular astigmatism, left eye: Secondary | ICD-10-CM | POA: Diagnosis not present

## 2021-07-25 DIAGNOSIS — H2511 Age-related nuclear cataract, right eye: Secondary | ICD-10-CM | POA: Diagnosis not present

## 2021-08-31 DIAGNOSIS — Z961 Presence of intraocular lens: Secondary | ICD-10-CM | POA: Diagnosis not present

## 2021-08-31 DIAGNOSIS — H35371 Puckering of macula, right eye: Secondary | ICD-10-CM | POA: Diagnosis not present

## 2021-08-31 DIAGNOSIS — H02831 Dermatochalasis of right upper eyelid: Secondary | ICD-10-CM | POA: Diagnosis not present

## 2021-08-31 DIAGNOSIS — Z90710 Acquired absence of both cervix and uterus: Secondary | ICD-10-CM | POA: Diagnosis not present

## 2021-08-31 DIAGNOSIS — H52221 Regular astigmatism, right eye: Secondary | ICD-10-CM | POA: Insufficient documentation

## 2021-08-31 DIAGNOSIS — H02834 Dermatochalasis of left upper eyelid: Secondary | ICD-10-CM | POA: Diagnosis not present

## 2021-08-31 DIAGNOSIS — Z9842 Cataract extraction status, left eye: Secondary | ICD-10-CM | POA: Diagnosis not present

## 2021-08-31 DIAGNOSIS — H5212 Myopia, left eye: Secondary | ICD-10-CM | POA: Diagnosis not present

## 2021-08-31 DIAGNOSIS — H25811 Combined forms of age-related cataract, right eye: Secondary | ICD-10-CM | POA: Insufficient documentation

## 2021-08-31 DIAGNOSIS — H52223 Regular astigmatism, bilateral: Secondary | ICD-10-CM | POA: Diagnosis not present

## 2021-08-31 DIAGNOSIS — H18513 Endothelial corneal dystrophy, bilateral: Secondary | ICD-10-CM | POA: Diagnosis not present

## 2021-10-16 ENCOUNTER — Telehealth: Payer: Self-pay | Admitting: Family Medicine

## 2021-10-16 ENCOUNTER — Other Ambulatory Visit (HOSPITAL_BASED_OUTPATIENT_CLINIC_OR_DEPARTMENT_OTHER): Payer: Self-pay | Admitting: Family Medicine

## 2021-10-16 DIAGNOSIS — Z1231 Encounter for screening mammogram for malignant neoplasm of breast: Secondary | ICD-10-CM

## 2021-10-16 NOTE — Telephone Encounter (Signed)
Contacted pharmacy back and spoke with [facility]. She states she called in regards to a different patient, B. Brantley. Message placed in the wrong chart

## 2021-10-16 NOTE — Telephone Encounter (Signed)
Noted and discussed.

## 2021-10-16 NOTE — Telephone Encounter (Signed)
Sara Martinez from [facility] contacted office regarding regarding pt's meds. Please contact [phone].

## 2021-10-23 ENCOUNTER — Ambulatory Visit (HOSPITAL_BASED_OUTPATIENT_CLINIC_OR_DEPARTMENT_OTHER)
Admission: RE | Admit: 2021-10-23 | Discharge: 2021-10-23 | Disposition: A | Discharge status: Home / Self Care | Payer: Medicare HMO | Source: Ambulatory Visit | Attending: Family Medicine | Admitting: Family Medicine

## 2021-10-23 ENCOUNTER — Other Ambulatory Visit: Payer: Self-pay

## 2021-10-23 ENCOUNTER — Encounter (HOSPITAL_BASED_OUTPATIENT_CLINIC_OR_DEPARTMENT_OTHER): Payer: Self-pay

## 2021-10-23 DIAGNOSIS — Z1231 Encounter for screening mammogram for malignant neoplasm of breast: Secondary | ICD-10-CM | POA: Diagnosis not present

## 2021-11-10 ENCOUNTER — Telehealth: Payer: Self-pay | Admitting: Family Medicine

## 2021-11-10 ENCOUNTER — Other Ambulatory Visit: Payer: Self-pay | Admitting: Family Medicine

## 2021-11-10 DIAGNOSIS — Z136 Encounter for screening for cardiovascular disorders: Secondary | ICD-10-CM

## 2021-11-10 NOTE — Telephone Encounter (Signed)
Pt would like labs done on 2/27 or 3/2 before appt on 3/6  Please advise.

## 2021-11-10 NOTE — Telephone Encounter (Signed)
Labs have been ordered. Just needs lab appointment before appt with [facility]

## 2021-11-10 NOTE — Telephone Encounter (Signed)
Please place future lab orders.   

## 2021-11-10 NOTE — Telephone Encounter (Signed)
Lvm for pt to schedule

## 2021-11-13 ENCOUNTER — Other Ambulatory Visit (INDEPENDENT_AMBULATORY_CARE_PROVIDER_SITE_OTHER): Payer: Medicare HMO

## 2021-11-13 DIAGNOSIS — Z136 Encounter for screening for cardiovascular disorders: Secondary | ICD-10-CM | POA: Diagnosis not present

## 2021-11-13 LAB — CBC WITH DIFFERENTIAL/PLATELET
Basophils Absolute: 0 10*3/uL (ref 0.0–0.1)
Basophils Relative: 0.6 % (ref 0.0–3.0)
Eosinophils Absolute: 0.1 10*3/uL (ref 0.0–0.7)
Eosinophils Relative: 0.8 % (ref 0.0–5.0)
HCT: 38.5 % (ref 36.0–46.0)
Hemoglobin: 12.5 g/dL (ref 12.0–15.0)
Lymphocytes Relative: 22.1 % (ref 12.0–46.0)
Lymphs Abs: 1.5 10*3/uL (ref 0.7–4.0)
MCHC: 32.5 g/dL (ref 30.0–36.0)
MCV: 78.5 fl (ref 78.0–100.0)
Monocytes Absolute: 0.5 10*3/uL (ref 0.1–1.0)
Monocytes Relative: 7.3 % (ref 3.0–12.0)
Neutro Abs: 4.8 10*3/uL (ref 1.4–7.7)
Neutrophils Relative %: 69.2 % (ref 43.0–77.0)
Platelets: 123 10*3/uL — ABNORMAL LOW (ref 150.0–400.0)
RBC: 4.91 Mil/uL (ref 3.87–5.11)
RDW: 14.6 % (ref 11.5–15.5)
WBC: 6.9 10*3/uL (ref 4.0–10.5)

## 2021-11-13 LAB — COMPREHENSIVE METABOLIC PANEL
ALT: 11 U/L (ref 0–35)
AST: 17 U/L (ref 0–37)
Albumin: 4.4 g/dL (ref 3.5–5.2)
Alkaline Phosphatase: 70 U/L (ref 39–117)
BUN: 9 mg/dL (ref 6–23)
CO2: 29 mEq/L (ref 19–32)
Calcium: 9.4 mg/dL (ref 8.4–10.5)
Chloride: 104 mEq/L (ref 96–112)
Creatinine, Ser: 0.8 mg/dL (ref 0.40–1.20)
GFR: 76.5 mL/min (ref >60.00)
Glucose, Bld: 95 mg/dL (ref 70–99)
Potassium: 4.3 mEq/L (ref 3.5–5.1)
Sodium: 140 mEq/L (ref 135–145)
Total Bilirubin: 0.7 mg/dL (ref 0.2–1.2)
Total Protein: 7.1 g/dL (ref 6.0–8.3)

## 2021-11-13 LAB — LIPID PANEL
Cholesterol: 216 mg/dL — ABNORMAL HIGH (ref 0–200)
HDL: 96.6 mg/dL (ref >39.00)
LDL Cholesterol: 103 mg/dL — ABNORMAL HIGH (ref 0–99)
NonHDL: 118.92
Total CHOL/HDL Ratio: 2
Triglycerides: 79 mg/dL (ref 0.0–149.0)
VLDL: 15.8 mg/dL (ref 0.0–40.0)

## 2021-11-20 ENCOUNTER — Ambulatory Visit (INDEPENDENT_AMBULATORY_CARE_PROVIDER_SITE_OTHER): Payer: Medicare HMO | Admitting: Family Medicine

## 2021-11-20 ENCOUNTER — Encounter: Payer: Self-pay | Admitting: Family Medicine

## 2021-11-20 VITALS — BP 110/70 | HR 72 | Temp 97.8°F | Resp 16 | Ht 66.0 in | Wt 162.0 lb

## 2021-11-20 DIAGNOSIS — E2839 Other primary ovarian failure: Secondary | ICD-10-CM | POA: Diagnosis not present

## 2021-11-20 DIAGNOSIS — Z0000 Encounter for general adult medical examination without abnormal findings: Secondary | ICD-10-CM | POA: Insufficient documentation

## 2021-11-20 NOTE — Progress Notes (Signed)
? ?Subjective:  ? ?By signing my name below, I, Carylon Perches, attest that this documentation has been prepared under the direction and in the presence of Roma Schanz DO, 11/20/2021 ? ? Patient ID: Sara Martinez, female    DOB: 19-Mar-1956, 66 y.o.   MRN: [id number] ? ?Chief Complaint  ?Patient presents with  ? Annual Exam  ? ? ?HPI ?Patient is in today for a comprehensive physical exam. ? ?She denies having any fever, ear pain, new muscle pain, joint pain, new moles, congestion, sinus pain, sore throat, palpations, wheezing, n/v/d, constipation, blood in stool, dysuria, frequency, hematuria at this time. ? ?Patient reports brother recently undergoing a CABG. ? ?Patient reports returning to work part - time at [employer]. ? ?She does not want to receive the bivalent COVID 19 vaccination and the pneumonia vaccination. ? ?Patient does not regularly exercise. She reports that she has gain weight. ?Wt Readings from Last 3 Encounters:  ?11/20/21 162 lb (73.5 kg)  ?12/01/20 151 lb (68.5 kg)  ?11/15/20 151 lb (68.5 kg)  ? ?She regularly goes to dentist and vision exams.  ?Past Medical History:  ?Diagnosis Date  ? Allergy   ? SEASONAL  ? Hx of adenomatous colonic polyps   ? 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 Torus palatinus   ? ? ?Past Surgical History:  ?Procedure Laterality Date  ? ABDOMINAL HYSTERECTOMY    ? patient report 2000 she had and hysterectomy  ? COLONOSCOPY  2016  ? COMBINED HYSTERECTOMY ABDOMINAL W/ MMK / [place] PROCEDURE  2000  ? CYSTOCELE RECTOCELE  ? ? ?Family History  ?Problem Relation Age of Onset  ? Ovarian cancer Mother   ? Hypertension Mother   ? Cancer Mother 61  ?     ovarian   ? Arthritis Mother   ? Prostate cancer Father   ? Cancer Father   ?     prostate  ? Arthritis Father   ? Heart disease Brother   ? Cancer Paternal Aunt   ?     breast  ? Colon cancer Neg Hx   ? Stomach cancer Neg Hx   ? Esophageal cancer Neg Hx   ? Rectal cancer Neg Hx   ? Colon polyps Neg Hx    ? ? ?Social History  ? ?Socioeconomic History  ? Marital status: Married  ?  Spouse name: Not on file  ? Number of children: 1  ? Years of education: Not on file  ? Highest education level: Not on file  ?Occupational History  ? Occupation: Marine scientist-- [employer]  ?  Employer: [employer]  ?Tobacco Use  ? Smoking status: Never  ? Smokeless tobacco: Never  ?Vaping Use  ? Vaping Use: Never used  ?Substance and Sexual Activity  ? Alcohol use: Yes  ?  Alcohol/week: 4.0 - 5.0 standard drinks  ?  Types: 4 - 5 Glasses of wine per week  ? Drug use: No  ? Sexual activity: Yes  ?  Partners: Male  ?Other Topics Concern  ? Not on file  ?Social History Narrative  ? Exercise-- no  ? ?Social Determinants of Health  ? ?Financial Resource Strain: Not on file  ?Food Insecurity: Not on file  ?Transportation Needs: Not on file  ?Physical Activity: Not on file  ?Stress: Not on file  ?Social Connections: Not on file  ?Intimate Partner Violence: Not on file  ? ? ?No outpatient medications prior to visit.  ? ?No facility-administered medications  prior to visit.  ? ? ?No Known Allergies ? ?Review of Systems  ?Constitutional:  Negative for fever.  ?HENT:  Negative for congestion, ear pain, sinus pain and sore throat.   ?Respiratory:  Negative for wheezing.   ?Cardiovascular:  Negative for palpitations.  ?Gastrointestinal:  Negative for blood in stool, constipation, diarrhea, nausea and vomiting.  ?Genitourinary:  Negative for dysuria, frequency and hematuria.  ?Musculoskeletal:  Negative for joint pain and myalgias.  ?Skin:   ?     (-) [place]  ? ?   ?Objective:  ?  ?Physical Exam ?Constitutional:   ?   General: She is not in acute distress. ?   Appearance: Normal appearance. She is not ill-appearing.  ?HENT:  ?   Head: Normocephalic and atraumatic.  ?   Right Ear: External ear normal.  ?   Left Ear: External ear normal.  ?Eyes:  ?   Extraocular Movements: Extraocular movements intact.  ?   Pupils: Pupils  are equal, round, and reactive to light.  ?Cardiovascular:  ?   Rate and Rhythm: Normal rate and regular rhythm.  ?   Heart sounds: Normal heart sounds. No murmur heard. ?  No gallop.  ?Pulmonary:  ?   Effort: Pulmonary effort is normal. No respiratory distress.  ?   Breath sounds: Normal breath sounds. No wheezing or rales.  ?Abdominal:  ?   General: Bowel sounds are normal. There is no distension.  ?   Palpations: Abdomen is soft.  ?   Tenderness: There is no abdominal tenderness. There is no guarding.  ?Skin: ?   General: Skin is warm and dry.  ?Neurological:  ?   Mental Status: She is alert and oriented to person, place, and time.  ?Psychiatric:     ?   Judgment: Judgment normal.  ? ? ?Colonoscopy: Last completed 12/01/2020. ?Mammogram: Last completed 10/23/2021. ?DEXA: Last completed 07/28/2018. Due for a DEXA scan 07/28/2020. ? ?BP 110/70 (BP Location: Left Arm, Patient Position: Sitting, Cuff Size: Normal)   Pulse 72   Temp 97.8 ?F (36.6 ?C) (Oral)   Resp 16   Ht '5\' 6"'$  (1.676 m)   Wt 162 lb (73.5 kg)   SpO2 100%   BMI 26.15 kg/m?  ?Wt Readings from Last 3 Encounters:  ?11/20/21 162 lb (73.5 kg)  ?12/01/20 151 lb (68.5 kg)  ?11/15/20 151 lb (68.5 kg)  ? ? ?Diabetic Foot Exam - Simple   ?No data filed ?  ? ?Lab Results  ?Component Value Date  ? WBC 6.9 11/13/2021  ? HGB 12.5 11/13/2021  ? HCT 38.5 11/13/2021  ? PLT 123.0 Repeated and verified X2. (L) 11/13/2021  ? GLUCOSE 95 11/13/2021  ? CHOL 216 (H) 11/13/2021  ? TRIG 79.0 11/13/2021  ? HDL 96.60 11/13/2021  ? LDLCALC 103 (H) 11/13/2021  ? ALT 11 11/13/2021  ? AST 17 11/13/2021  ? NA 140 11/13/2021  ? K 4.3 11/13/2021  ? CL 104 11/13/2021  ? CREATININE 0.80 11/13/2021  ? BUN 9 11/13/2021  ? CO2 29 11/13/2021  ? TSH 1.00 07/25/2018  ? ? ?Lab Results  ?Component Value Date  ? TSH 1.00 07/25/2018  ? ?Lab Results  ?Component Value Date  ? WBC 6.9 11/13/2021  ? HGB 12.5 11/13/2021  ? HCT 38.5 11/13/2021  ? MCV 78.5 11/13/2021  ? PLT 123.0 Repeated and  verified X2. (L) 11/13/2021  ? ?Lab Results  ?Component Value Date  ? NA 140 11/13/2021  ? K 4.3 11/13/2021  ?  CO2 29 11/13/2021  ? GLUCOSE 95 11/13/2021  ? BUN 9 11/13/2021  ? CREATININE 0.80 11/13/2021  ? BILITOT 0.7 11/13/2021  ? ALKPHOS 70 11/13/2021  ? AST 17 11/13/2021  ? ALT 11 11/13/2021  ? PROT 7.1 11/13/2021  ? ALBUMIN 4.4 11/13/2021  ? CALCIUM 9.4 11/13/2021  ? GFR 76.50 11/13/2021  ? ?Lab Results  ?Component Value Date  ? CHOL 216 (H) 11/13/2021  ? ?Lab Results  ?Component Value Date  ? HDL 96.60 11/13/2021  ? ?Lab Results  ?Component Value Date  ? LDLCALC 103 (H) 11/13/2021  ? ?Lab Results  ?Component Value Date  ? TRIG 79.0 11/13/2021  ? ?Lab Results  ?Component Value Date  ? CHOLHDL 2 11/13/2021  ? ?No results found for: HGBA1C ? ?   ?Assessment & Plan:  ? ?Problem List Items Addressed This Visit   ? ?  ? Unprioritized  ? Preventative health care - Primary  ?  ghm utd ?Check labs  ?See avs  ?  ?  ? ?Other Visit Diagnoses   ? ? Estrogen deficiency      ? Relevant Orders  ? DG Bone Density  ? ?  ? ? ? ? ?No orders of the defined types were placed in this encounter. ? ? ?I, Ann Held, DO, personally preformed the services described in this documentation.  All medical record entries made by the scribe were at my direction and in my presence.  I have reviewed the chart and discharge instructions (if applicable) and agree that the record reflects my personal performance and is accurate and complete. 11/20/2021 ? ? ?I,Amber Collins,acting as a Education administrator for [employer], DO.,have documented all relevant documentation on the behalf of Ann Held, DO,as directed by  Ann Held, DO while in the presence of Ann Held, DO. ? ? ? ?Ann Held, DO ? ?

## 2021-11-20 NOTE — Assessment & Plan Note (Signed)
ghm utd Check labs  See avs  

## 2021-11-20 NOTE — Patient Instructions (Signed)
Preventive Care 34 Years and Older, Female ?Preventive care refers to lifestyle choices and visits with your health care provider that can promote health and wellness. Preventive care visits are also called wellness exams. ?What can I expect for my preventive care visit? ?Counseling ?Your health care provider may ask you questions about your: ?Medical history, including: ?Past medical problems. ?Family medical history. ?Pregnancy and menstrual history. ?History of falls. ?Current health, including: ?Memory and ability to understand (cognition). ?Emotional well-being. ?Home life and relationship well-being. ?Sexual activity and sexual health. ?Lifestyle, including: ?Alcohol, nicotine or tobacco, and drug use. ?Access to firearms. ?Diet, exercise, and sleep habits. ?Work and work Statistician. ?Sunscreen use. ?Safety issues such as seatbelt and bike helmet use. ?Physical exam ?Your health care provider will check your: ?Height and weight. These may be used to calculate your BMI (body mass index). BMI is a measurement that tells if you are at a healthy weight. ?Waist circumference. This measures the distance around your waistline. This measurement also tells if you are at a healthy weight and may help predict your risk of certain diseases, such as type 2 diabetes and high blood pressure. ?Heart rate and blood pressure. ?Body temperature. ?Skin for abnormal spots. ?What immunizations do I need? ?Vaccines are usually given at various ages, according to a schedule. Your health care provider will recommend vaccines for you based on your age, medical history, and lifestyle or other factors, such as travel or where you work. ?What tests do I need? ?Screening ?Your health care provider may recommend screening tests for certain conditions. This may include: ?Lipid and cholesterol levels. ?Hepatitis C test. ?Hepatitis B test. ?HIV (human immunodeficiency virus) test. ?STI (sexually transmitted infection) testing, if you are at  risk. ?Lung cancer screening. ?Colorectal cancer screening. ?Diabetes screening. This is done by checking your blood sugar (glucose) after you have not eaten for a while (fasting). ?Mammogram. Talk with your health care provider about how often you should have regular mammograms. ?BRCA-related cancer screening. This may be done if you have a family history of breast, ovarian, tubal, or peritoneal cancers. ?Bone density scan. This is done to screen for osteoporosis. ?Talk with your health care provider about your test results, treatment options, and if necessary, the need for more tests. ?Follow these instructions at home: ?Eating and drinking ? ?Eat a diet that includes fresh fruits and vegetables, whole grains, lean protein, and low-fat dairy products. Limit your intake of foods with high amounts of sugar, saturated fats, and salt. ?Take vitamin and mineral supplements as recommended by your health care provider. ?Do not drink alcohol if your health care provider tells you not to drink. ?If you drink alcohol: ?Limit how much you have to 0-1 drink a day. ?Know how much alcohol is in your drink. In the [place] one drink equals one 12 oz bottle of beer (355 mL), one 5 oz glass of wine (148 mL), or one 1? oz glass of hard liquor (44 mL). ?Lifestyle ?Brush your teeth every morning and night with fluoride toothpaste. Floss one time each day. ?Exercise for at least 30 minutes 5 or more days each week. ?Do not use any products that contain nicotine or tobacco. These products include cigarettes, chewing tobacco, and vaping devices, such as e-cigarettes. If you need help quitting, ask your health care provider. ?Do not use drugs. ?If you are sexually active, practice safe sex. Use a condom or other form of protection in order to prevent STIs. ?Take aspirin only as told by your  health care provider. Make sure that you understand how much to take and what form to take. Work with your health care provider to find out whether it  is safe and beneficial for you to take aspirin daily. ?Ask your health care provider if you need to take a cholesterol-lowering medicine (statin). ?Find healthy ways to manage stress, such as: ?Meditation, yoga, or listening to music. ?Journaling. ?Talking to a trusted person. ?Spending time with friends and family. ?Minimize exposure to UV radiation to reduce your risk of skin cancer. ?Safety ?Always wear your seat belt while driving or riding in a vehicle. ?Do not drive: ?If you have been drinking alcohol. Do not ride with someone who has been drinking. ?When you are tired or distracted. ?While texting. ?If you have been using any mind-altering substances or drugs. ?Wear a helmet and other protective equipment during sports activities. ?If you have firearms in your house, make sure you follow all gun safety procedures. ?What's next? ?Visit your health care provider once a year for an annual wellness visit. ?Ask your health care provider how often you should have your eyes and teeth checked. ?Stay up to date on all vaccines. ?This information is not intended to replace advice given to you by your health care provider. Make sure you discuss any questions you have with your health care provider. ?Document Revised: 03/01/2021 Document Reviewed: 03/01/2021 ?Elsevier Patient Education ? [place]. ? ?

## 2021-12-11 DIAGNOSIS — H1045 Other chronic allergic conjunctivitis: Secondary | ICD-10-CM | POA: Diagnosis not present

## 2021-12-11 DIAGNOSIS — Z961 Presence of intraocular lens: Secondary | ICD-10-CM | POA: Diagnosis not present

## 2021-12-11 DIAGNOSIS — H52202 Unspecified astigmatism, left eye: Secondary | ICD-10-CM | POA: Diagnosis not present

## 2021-12-18 ENCOUNTER — Ambulatory Visit (HOSPITAL_BASED_OUTPATIENT_CLINIC_OR_DEPARTMENT_OTHER)
Admission: RE | Admit: 2021-12-18 | Discharge: 2021-12-18 | Disposition: A | Discharge status: Home / Self Care | Payer: Medicare HMO | Source: Ambulatory Visit | Attending: Family Medicine | Admitting: Family Medicine

## 2021-12-18 DIAGNOSIS — E2839 Other primary ovarian failure: Secondary | ICD-10-CM | POA: Diagnosis not present

## 2021-12-18 DIAGNOSIS — M810 Age-related osteoporosis without current pathological fracture: Secondary | ICD-10-CM | POA: Diagnosis not present

## 2021-12-18 DIAGNOSIS — M8588 Other specified disorders of bone density and structure, other site: Secondary | ICD-10-CM | POA: Diagnosis not present

## 2021-12-22 DIAGNOSIS — M21621 Bunionette of right foot: Secondary | ICD-10-CM | POA: Diagnosis not present

## 2021-12-22 DIAGNOSIS — D2371 Other benign neoplasm of skin of right lower limb, including hip: Secondary | ICD-10-CM | POA: Diagnosis not present

## 2021-12-22 DIAGNOSIS — M792 Neuralgia and neuritis, unspecified: Secondary | ICD-10-CM | POA: Diagnosis not present

## 2021-12-22 DIAGNOSIS — Q828 Other specified congenital malformations of skin: Secondary | ICD-10-CM | POA: Diagnosis not present

## 2021-12-22 DIAGNOSIS — M205X1 Other deformities of toe(s) (acquired), right foot: Secondary | ICD-10-CM | POA: Diagnosis not present

## 2022-02-14 ENCOUNTER — Telehealth: Payer: Medicare HMO | Admitting: Physician Assistant

## 2022-02-14 DIAGNOSIS — R3989 Other symptoms and signs involving the genitourinary system: Secondary | ICD-10-CM

## 2022-02-14 MED ORDER — SULFAMETHOXAZOLE-TRIMETHOPRIM 800-160 MG PO TABS: 1.0000 | ORAL_TABLET | Freq: Two times a day (BID) | ORAL | 0 refills | Status: AC

## 2022-02-14 NOTE — Progress Notes (Signed)
I have spent 5 minutes in review of e-visit questionnaire, review and updating patient chart, medical decision making and response to patient.   Audree Schrecengost Cody Jabarie Pop, PA-C    

## 2022-02-14 NOTE — Progress Notes (Signed)
E-Visit for Urinary Problems  We are sorry that you are not feeling well.  Here is how we plan to help!  Based on what you shared with me it looks like you most likely have a simple urinary tract infection.  A UTI (Urinary Tract Infection) is a bacterial infection of the bladder.  Most cases of urinary tract infections are simple to treat but a key part of your care is to encourage you to drink plenty of fluids and watch your symptoms carefully.  I have prescribed Bactrim DS One tablet twice a day for 5 days.  Your symptoms should gradually improve. Call us if the burning in your urine worsens, you develop worsening fever, back pain or pelvic pain or if your symptoms do not resolve after completing the antibiotic.  Urinary tract infections can be prevented by drinking plenty of water to keep your body hydrated.  Also be sure when you wipe, wipe from front to back and don't hold it in!  If possible, empty your bladder every 4 hours.  HOME CARE Drink plenty of fluids Compete the full course of the antibiotics even if the symptoms resolve Remember, when you need to go.go. Holding in your urine can increase the likelihood of getting a UTI! GET HELP RIGHT AWAY IF: You cannot urinate You get a high fever Worsening back pain occurs You see blood in your urine You feel sick to your stomach or throw up You feel like you are going to pass out  MAKE SURE YOU  Understand these instructions. Will watch your condition. Will get help right away if you are not doing well or get worse.   Thank you for choosing an e-visit.  Your e-visit answers were reviewed by a board certified advanced clinical practitioner to complete your personal care plan. Depending upon the condition, your plan could have included both over the counter or prescription medications.  Please review your pharmacy choice. Make sure the pharmacy is open so you can pick up prescription now. If there is a problem, you may contact  your provider through [employer] and have the prescription routed to another pharmacy.  Your safety is important to [place]. If you have drug allergies check your prescription carefully.   For the next 24 hours you can use MyChart to ask questions about today's visit, request a non-urgent call back, or ask for a work or school excuse. You will get an email in the next two days asking about your experience. I hope that your e-visit has been valuable and will speed your recovery.

## 2022-04-20 ENCOUNTER — Telehealth: Payer: Self-pay | Admitting: Family Medicine

## 2022-04-20 NOTE — Telephone Encounter (Signed)
Left message for patient to call back and schedule Medicare Annual Wellness Visit (AWV).   Please offer to do virtually or by telephone.  Left office number and my jabber [phone].  Welcome to Medicare: 08/04/2020  Please schedule at anytime with Nurse Health Advisor.

## 2022-05-28 ENCOUNTER — Encounter: Payer: Self-pay | Admitting: Family Medicine

## 2022-05-28 ENCOUNTER — Other Ambulatory Visit: Payer: Self-pay | Admitting: Family Medicine

## 2022-05-28 DIAGNOSIS — L719 Rosacea, unspecified: Secondary | ICD-10-CM

## 2022-05-28 MED ORDER — METRONIDAZOLE 0.75 % EX GEL: Freq: Two times a day (BID) | CUTANEOUS | 1 refills | Status: DC

## 2022-07-19 DIAGNOSIS — Z23 Encounter for immunization: Secondary | ICD-10-CM | POA: Diagnosis not present

## 2022-08-29 ENCOUNTER — Telehealth: Payer: Self-pay | Admitting: Family Medicine

## 2022-08-29 NOTE — Telephone Encounter (Signed)
Copied from [place] [id number]. Topic: Medicare AWV >> Aug 29, 2022 10:27 AM Gillis Santa wrote: Reason for CRM: LVM PATIENT TO CALL [phone] TO SCHEDULE AWVI [place]

## 2022-09-21 IMAGING — MG MM DIGITAL SCREENING BILAT W/ TOMO AND CAD
8 series · 9 of 24 positions shown · non-contrast
Comparison: Previous exam(s).

CLINICAL DATA: Screening.

EXAM:
DIGITAL SCREENING BILATERAL MAMMOGRAM WITH TOMOSYNTHESIS AND CAD
TECHNIQUE: Bilateral screening digital craniocaudal and mediolateral oblique
mammograms were obtained. Bilateral screening digital breast
tomosynthesis was performed. The images were evaluated with
computer-aided detection.

[R MLO synth-2D]
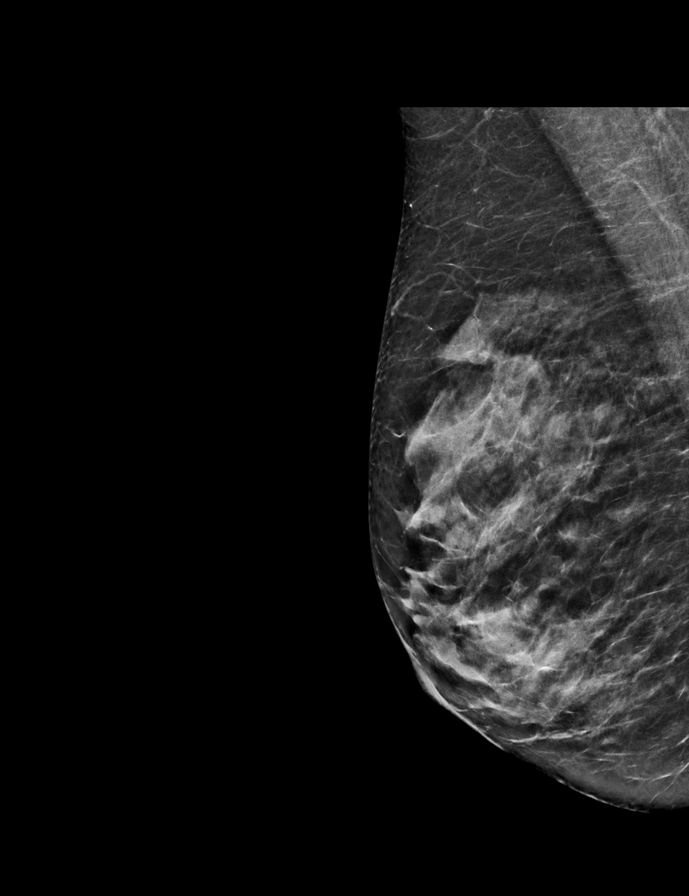

[R CC synth-2D]
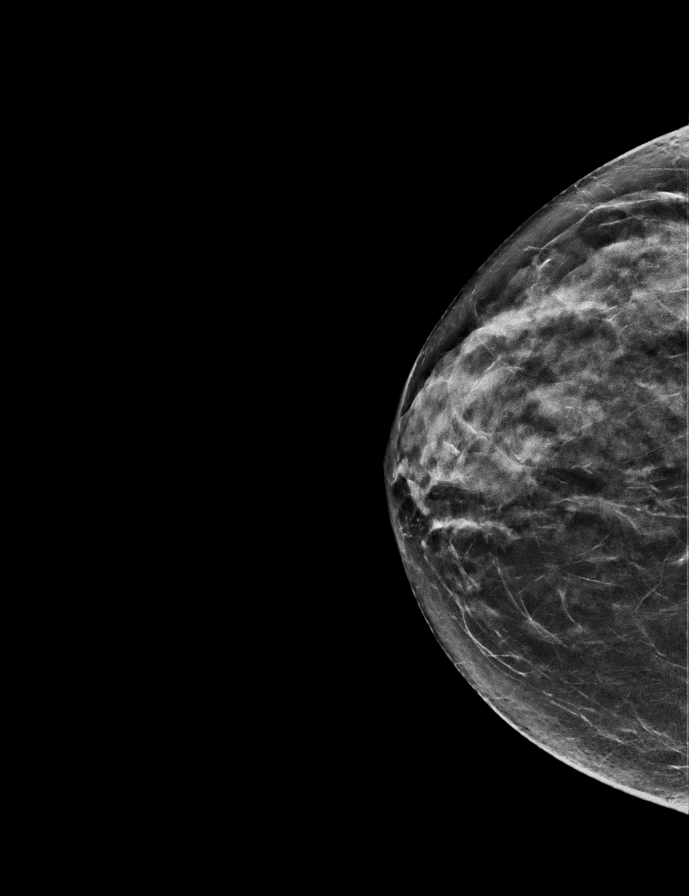

[L CC synth-2D]
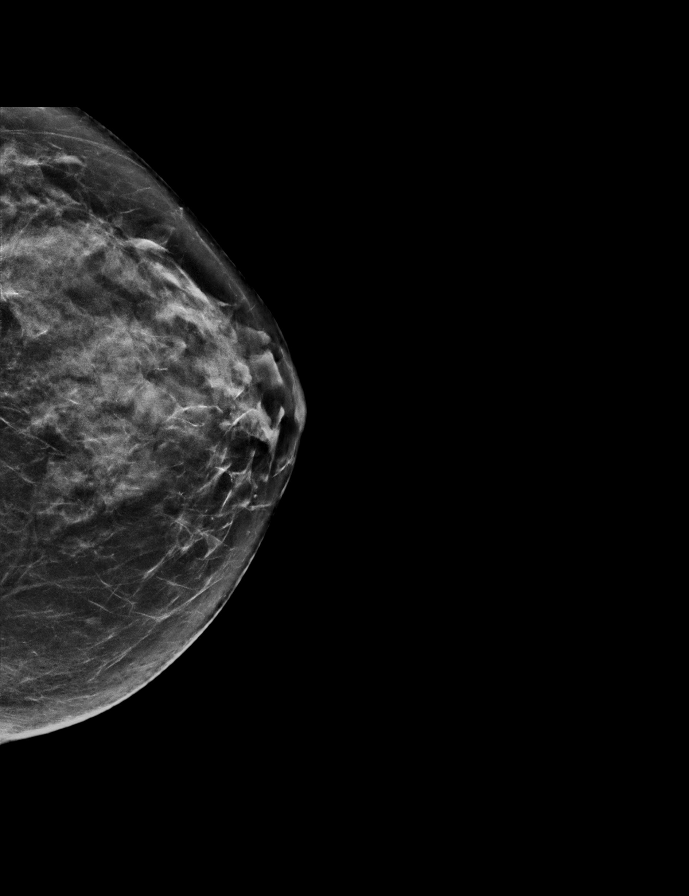

[L MLO synth-2D]
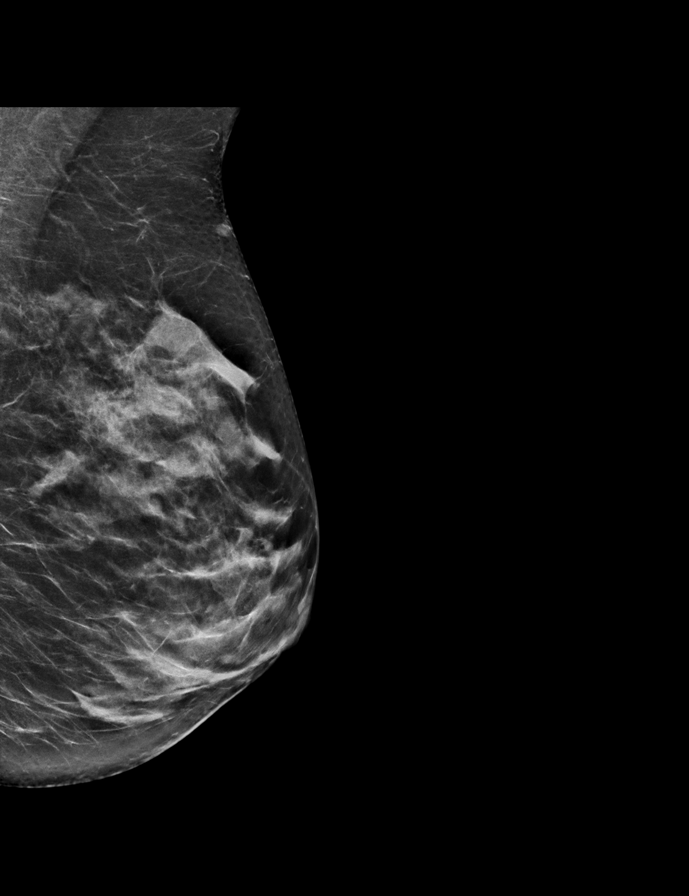

[R CC tomo · 2 of 56 frames shown]
[frame 19/56]
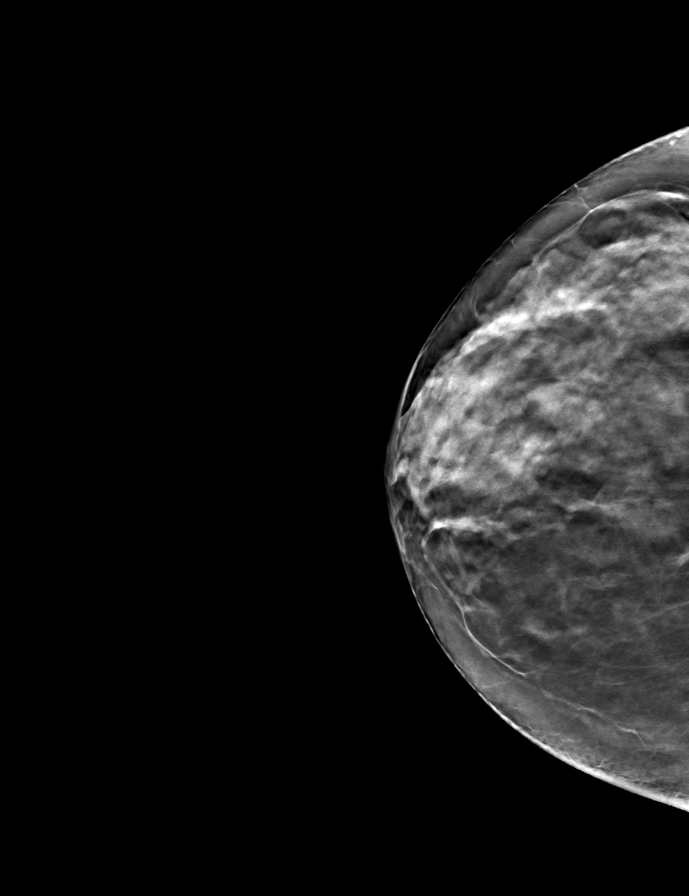
[frame 29/56]
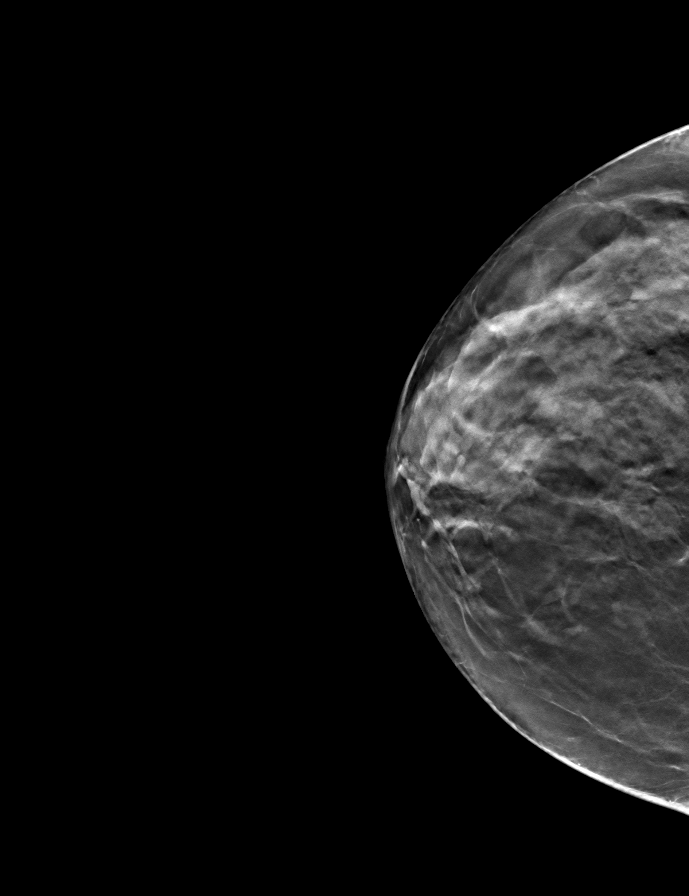

[R MLO tomo · tomo slice 29/58.0]
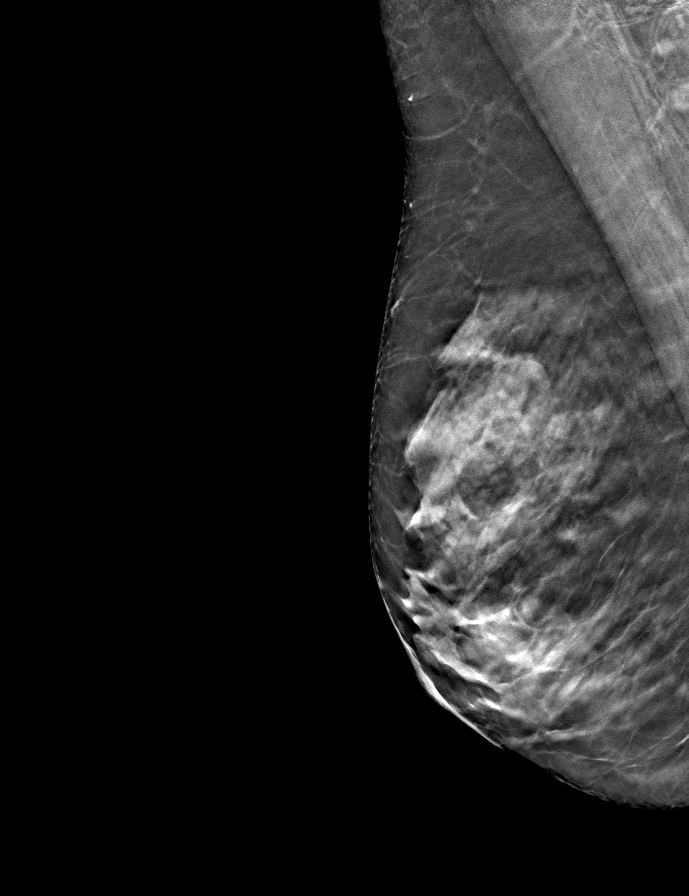

[L MLO tomo · tomo slice 31/61.0]
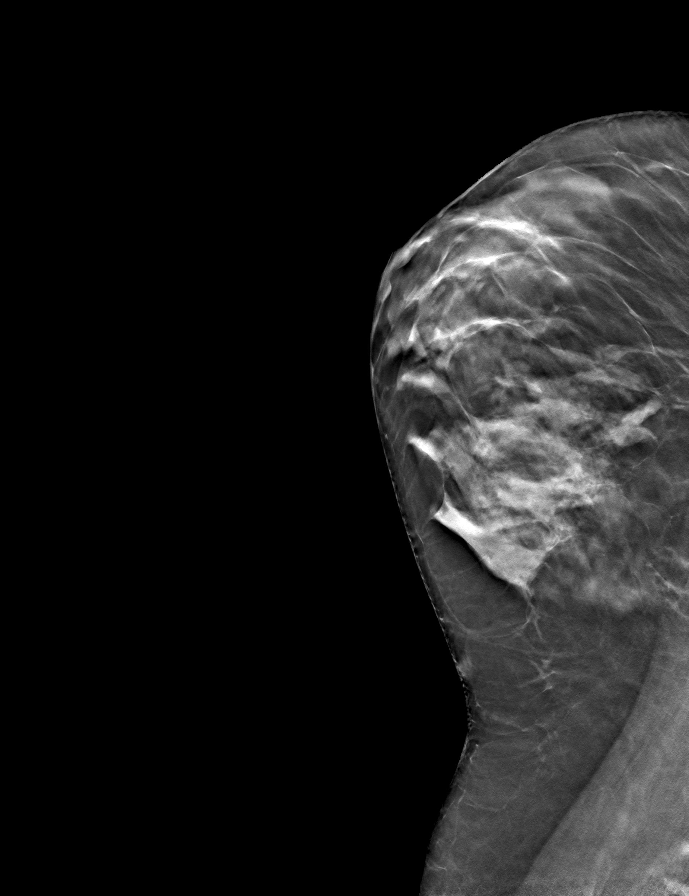

[L CC tomo · tomo slice 29/57.0]
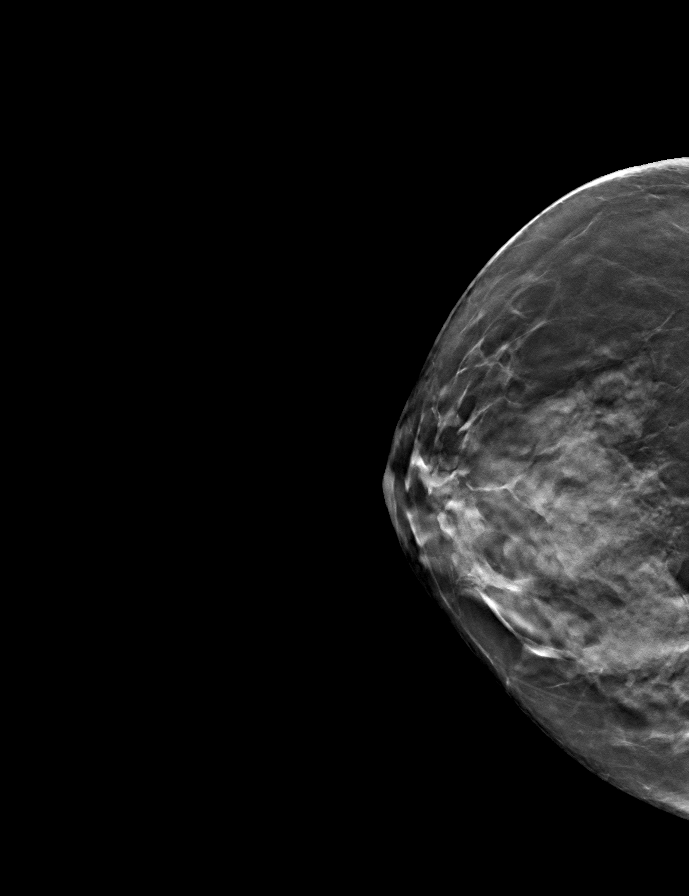

[9 of 24 positions shown; findings below may reference images not displayed]

ACR Breast Density Category c: The breast tissue is heterogeneously
dense, which may obscure small masses.
FINDINGS: There are no findings suspicious for malignancy.
IMPRESSION: No mammographic evidence of malignancy. A result letter of this
screening mammogram will be mailed directly to the patient.

RECOMMENDATION:
Screening mammogram in one year. (Code:Q3-W-BC3)

BI-RADS CATEGORY  1: Negative.

## 2022-10-02 ENCOUNTER — Telehealth: Payer: Self-pay | Admitting: Family Medicine

## 2022-10-02 NOTE — Telephone Encounter (Signed)
Copied from [place] [id number]. Topic: Medicare AWV >> Oct 02, 2022 12:13 PM Gillis Santa wrote: Reason for CRM: LVM PATIENT TO CALL [place]

## 2022-11-06 ENCOUNTER — Other Ambulatory Visit (HOSPITAL_BASED_OUTPATIENT_CLINIC_OR_DEPARTMENT_OTHER): Payer: Self-pay | Admitting: Family Medicine

## 2022-11-06 DIAGNOSIS — Z1231 Encounter for screening mammogram for malignant neoplasm of breast: Secondary | ICD-10-CM

## 2022-11-09 ENCOUNTER — Encounter: Payer: Self-pay | Admitting: Family Medicine

## 2022-11-09 ENCOUNTER — Ambulatory Visit (INDEPENDENT_AMBULATORY_CARE_PROVIDER_SITE_OTHER): Payer: Medicare HMO | Admitting: Family Medicine

## 2022-11-09 VITALS — BP 138/80 | HR 63 | Temp 97.7°F | Resp 18 | Ht 66.0 in | Wt 166.0 lb

## 2022-11-09 DIAGNOSIS — R1013 Epigastric pain: Secondary | ICD-10-CM

## 2022-11-09 MED ORDER — OMEPRAZOLE 20 MG PO CPDR: 20.0000 mg | DELAYED_RELEASE_CAPSULE | Freq: Every day | ORAL | 3 refills | Status: DC

## 2022-11-09 NOTE — Progress Notes (Signed)
Subjective:   By signing my name below, I, Eugene Gavia, attest that this documentation has been prepared under the direction and in the presence of Ann Held, DO 11/09/22   Patient ID: Sara Martinez, female    DOB: [birth year], 67 y.o.   MRN: [id number]  Chief Complaint  Patient presents with   Abdominal Pain    Pt states having abdominal pain around the belly button. X2 months, no nausea/vomting, no constipation or diarrhea. Pt states having a burning sensation across her stomach. Pt states taking Prilosec otc for a few weeks with some relief.     HPI Patient is in today for evaluation of abdominal pain.  Patient complains of burning epigastric abdominal pain. She denies any significant belching or heartburn. Her pain is not triggered by eating. She states that she has been taking her husband's Prilosec '20mg'$  and her pain has started to improve. She notes some accompanying nausea but denies any vomiting or diarrhea.  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   Torus palatinus     Past Surgical History:  Procedure Laterality Date   ABDOMINAL HYSTERECTOMY     patient report 2000 she had and hysterectomy   COLONOSCOPY  2016   COMBINED HYSTERECTOMY ABDOMINAL W/ MMK / [place] PROCEDURE  2000   CYSTOCELE RECTOCELE    Family History  Problem Relation Age of Onset   Ovarian cancer Mother    Hypertension Mother    Cancer Mother 28       ovarian    Arthritis Mother    Prostate cancer Father    Cancer Father        prostate   Arthritis Father    Heart disease Brother    Cancer Paternal Aunt        breast   Colon cancer Neg Hx    Stomach cancer Neg Hx    Esophageal cancer Neg Hx    Rectal cancer Neg Hx    Colon polyps Neg Hx     Social History   Socioeconomic History   Marital status: Married    Spouse name: Not on file   Number of  children: 1   Years of education: Not on file   Highest education level: Not on file  Occupational History   Occupation: Marine scientist-- [employer]    Employer: [employer]  Tobacco Use   Smoking status: Never   Smokeless tobacco: Never  Vaping Use   Vaping Use: Never used  Substance and Sexual Activity   Alcohol use: Yes    Alcohol/week: 4.0 - 5.0 standard drinks of alcohol    Types: 4 - 5 Glasses of wine per week   Drug use: No   Sexual activity: Yes    Partners: Male  Other Topics Concern   Not on file  Social History Narrative   Exercise-- no   Social Determinants of Health   Financial Resource Strain: Not on file  Food Insecurity: Not on file  Transportation Needs: Not on file  Physical Activity: Not on file  Stress: Not on file  Social Connections: Not on file  Intimate Partner Violence: Not on file    Outpatient Medications Prior to Visit  Medication Sig Dispense Refill   metroNIDAZOLE (METROGEL) 0.75 % gel Apply topically 2 (two) times daily. 45 g 1   No facility-administered  medications prior to visit.    No Known Allergies  Review of Systems  Constitutional:  Negative for fever.  HENT:  Negative for congestion, sinus pain and sore throat.   Respiratory:  Negative for cough, shortness of breath and wheezing.   Cardiovascular:  Negative for chest pain and palpitations.  Gastrointestinal:  Positive for abdominal pain and nausea. Negative for constipation, diarrhea, heartburn and vomiting.  Genitourinary:  Negative for dysuria, frequency and hematuria.  Neurological:  Negative for headaches.       Objective:    Physical Exam Vitals and nursing note reviewed.  Constitutional:      General: She is not in acute distress.    Appearance: Normal appearance. She is well-developed. She is not ill-appearing.  HENT:     Head: Normocephalic and atraumatic.     Right Ear: External ear normal.     Left Ear: External ear normal.   Eyes:     Extraocular Movements: Extraocular movements intact.     Conjunctiva/sclera: Conjunctivae normal.     Pupils: Pupils are equal, round, and reactive to light.  Neck:     Thyroid: No thyromegaly.     Vascular: No carotid bruit or JVD.  Cardiovascular:     Rate and Rhythm: Normal rate and regular rhythm.     Heart sounds: Normal heart sounds. No murmur heard.    No gallop.  Pulmonary:     Effort: Pulmonary effort is normal. No respiratory distress.     Breath sounds: Normal breath sounds. No wheezing or rales.  Chest:     Chest wall: No tenderness.  Abdominal:     Tenderness: There is abdominal tenderness in the epigastric area.  Musculoskeletal:     Cervical back: Normal range of motion and neck supple.     Right lower leg: Swelling present. No tenderness.     Left lower leg: Swelling present. No tenderness.  Skin:    General: Skin is warm and dry.  Neurological:     General: No focal deficit present.     Mental Status: She is alert and oriented to person, place, and time.  Psychiatric:        Judgment: Judgment normal.     BP 138/80 (BP Location: Left Arm, Patient Position: Sitting, Cuff Size: Normal)   Pulse 63   Temp 97.7 F (36.5 C) (Oral)   Resp 18   Ht '5\' 6"'$  (1.676 m)   Wt 166 lb (75.3 kg)   SpO2 99%   BMI 26.79 kg/m  Wt Readings from Last 3 Encounters:  11/09/22 166 lb (75.3 kg)  11/20/21 162 lb (73.5 kg)  12/01/20 151 lb (68.5 kg)       Assessment & Plan:  Dyspepsia Assessment & Plan: Omeprazole daily  Gerd HO on diet given to pt  Check labs   Orders: -     Omeprazole; Take 1 capsule (20 mg total) by mouth daily.  Dispense: 30 capsule; Refill: 3 -     CBC with Differential/Platelet -     Comprehensive metabolic panel -     Lipid panel -     H. pylori breath test     I,Alexis Herring,acting as a scribe for Ann Held, DO.,have documented all relevant documentation on the behalf of Ann Held, DO,as directed by   Ann Held, DO while in the presence of [place], DO, personally preformed the services described  in this documentation.  All medical record entries made by the scribe were at my direction and in my presence.  I have reviewed the chart and discharge instructions (if applicable) and agree that the record reflects my personal performance and is accurate and complete. 11/09/22   Ann Held, DO

## 2022-11-09 NOTE — Patient Instructions (Signed)
Food Choices for Gastroesophageal Reflux Disease, Adult When you have gastroesophageal reflux disease (GERD), the foods you eat and your eating habits are very important. Choosing the right foods can help ease the discomfort of GERD. Consider working with a dietitian to help you make healthy food choices. What are tips for following this plan? Reading food labels Look for foods that are low in saturated fat. Foods that have less than 5% of daily value (DV) of fat and 0 g of trans fats may help with your symptoms. Cooking Cook foods using methods other than frying. This may include baking, steaming, grilling, or broiling. These are all methods that do not need a lot of fat for cooking. To add flavor, try to use herbs that are low in spice and acidity. Meal planning  Choose healthy foods that are low in fat, such as fruits, vegetables, whole grains, low-fat dairy products, lean meats, fish, and poultry. Eat frequent, small meals instead of three large meals each day. Eat your meals slowly, in a relaxed setting. Avoid bending over or lying down until 2-3 hours after eating. Limit high-fat foods such as fatty meats or fried foods. Limit your intake of fatty foods, such as oils, butter, and shortening. Avoid the following as told by your health care provider: Foods that cause symptoms. These may be different for different people. Keep a food diary to keep track of foods that cause symptoms. Alcohol. Drinking large amounts of liquid with meals. Eating meals during the 2-3 hours before bed. Lifestyle Maintain a healthy weight. Ask your health care provider what weight is healthy for you. If you need to lose weight, work with your health care provider to do so safely. Exercise for at least 30 minutes on 5 or more days each week, or as told by your health care provider. Avoid wearing clothes that fit tightly around your waist and chest. Do not use any products that contain nicotine or tobacco. These  products include cigarettes, chewing tobacco, and vaping devices, such as e-cigarettes. If you need help quitting, ask your health care provider. Sleep with the head of your bed raised. Use a wedge under the mattress or blocks under the bed frame to raise the head of the bed. Chew sugar-free gum after mealtimes. What foods should I eat?  Eat a healthy, well-balanced diet of fruits, vegetables, whole grains, low-fat dairy products, lean meats, fish, and poultry. Each person is different. Foods that may trigger symptoms in one person may not trigger any symptoms in another person. Work with your health care provider to identify foods that are safe for you. The items listed above may not be a complete list of recommended foods and beverages. Contact a dietitian for more information. What foods should I avoid? Limiting some of these foods may help manage the symptoms of GERD. Everyone is different. Consult a dietitian or your health care provider to help you identify the exact foods to avoid, if any. Fruits Any fruits prepared with added fat. Any fruits that cause symptoms. For some people this may include citrus fruits, such as oranges, grapefruit, pineapple, and lemons. Vegetables Deep-fried vegetables. French fries. Any vegetables prepared with added fat. Any vegetables that cause symptoms. For some people, this may include tomatoes and tomato products, chili peppers, onions and garlic, and horseradish. Grains Pastries or quick breads with added fat. Meats and other proteins High-fat meats, such as fatty beef or pork, hot dogs, ribs, ham, sausage, salami, and bacon. Fried meat or protein, including   fried fish and fried chicken. Nuts and nut butters, in large amounts. Dairy Whole milk and chocolate milk. Sour cream. Cream. Ice cream. Cream cheese. Milkshakes. Fats and oils Butter. Margarine. Shortening. Ghee. Beverages Coffee and tea, with or without caffeine. Carbonated beverages. Sodas. Energy  drinks. Fruit juice made with acidic fruits, such as orange or grapefruit. Tomato juice. Alcoholic drinks. Sweets and desserts Chocolate and cocoa. Donuts. Seasonings and condiments Pepper. Peppermint and spearmint. Added salt. Any condiments, herbs, or seasonings that cause symptoms. For some people, this may include curry, hot sauce, or vinegar-based salad dressings. The items listed above may not be a complete list of foods and beverages to avoid. Contact a dietitian for more information. Questions to ask your health care provider Diet and lifestyle changes are usually the first steps that are taken to manage symptoms of GERD. If diet and lifestyle changes do not improve your symptoms, talk with your health care provider about taking medicines. Where to find more information International [employer] for Gastrointestinal [employer]: aboutgerd.org Summary When you have gastroesophageal reflux disease (GERD), food and lifestyle choices may be very helpful in easing the discomfort of GERD. Eat frequent, small meals instead of three large meals each day. Eat your meals slowly, in a relaxed setting. Avoid bending over or lying down until 2-3 hours after eating. Limit high-fat foods such as fatty meats or fried foods. This information is not intended to replace advice given to you by your health care provider. Make sure you discuss any questions you have with your health care provider. Document Revised: 03/14/2020 Document Reviewed: 03/14/2020 Elsevier Patient Education  2023 Elsevier Inc.  

## 2022-11-09 NOTE — Assessment & Plan Note (Signed)
Omeprazole daily  Gerd HO on diet given to pt  Check labs

## 2022-11-10 LAB — LIPID PANEL
Cholesterol: 205 mg/dL — ABNORMAL HIGH (ref <200)
HDL: 74 mg/dL (ref >=50)
LDL Cholesterol (Calc): 99 mg/dL (calc)
Non-HDL Cholesterol (Calc): 131 mg/dL (calc) — ABNORMAL HIGH (ref <130)
Total CHOL/HDL Ratio: 2.8 (calc) (ref <5.0)
Triglycerides: 199 mg/dL — ABNORMAL HIGH (ref <150)

## 2022-11-10 LAB — CBC WITH DIFFERENTIAL/PLATELET
Absolute Monocytes: 697 cells/uL (ref 200–950)
Basophils Absolute: 26 cells/uL (ref 0–200)
Basophils Relative: 0.3 %
Eosinophils Absolute: 52 cells/uL (ref 15–500)
Eosinophils Relative: 0.6 %
HCT: 39.9 % (ref 35.0–45.0)
Hemoglobin: 13.6 g/dL (ref 11.7–15.5)
Lymphs Abs: 2124 cells/uL (ref 850–3900)
MCH: 28.8 pg (ref 27.0–33.0)
MCHC: 34.1 g/dL (ref 32.0–36.0)
MCV: 84.4 fL (ref 80.0–100.0)
Monocytes Relative: 8.1 %
Neutro Abs: 5702 cells/uL (ref 1500–7800)
Neutrophils Relative %: 66.3 %
Platelets: 135 10*3/uL — ABNORMAL LOW (ref 140–400)
RBC: 4.73 10*6/uL (ref 3.80–5.10)
RDW: 12.6 % (ref 11.0–15.0)
Total Lymphocyte: 24.7 %
WBC: 8.6 10*3/uL (ref 3.8–10.8)

## 2022-11-10 LAB — COMPREHENSIVE METABOLIC PANEL
AG Ratio: 1.6 (calc) (ref 1.0–2.5)
ALT: 13 U/L (ref 6–29)
AST: 16 U/L (ref 10–35)
Albumin: 4.1 g/dL (ref 3.6–5.1)
Alkaline phosphatase (APISO): 62 U/L (ref 37–153)
BUN: 14 mg/dL (ref 7–25)
CO2: 27 mmol/L (ref 20–32)
Calcium: 9.1 mg/dL (ref 8.6–10.4)
Chloride: 105 mmol/L (ref 98–110)
Creat: 0.97 mg/dL (ref 0.50–1.05)
Globulin: 2.5 g/dL (calc) (ref 1.9–3.7)
Glucose, Bld: 91 mg/dL (ref 65–99)
Potassium: 4.2 mmol/L (ref 3.5–5.3)
Sodium: 141 mmol/L (ref 135–146)
Total Bilirubin: 0.5 mg/dL (ref 0.2–1.2)
Total Protein: 6.6 g/dL (ref 6.1–8.1)

## 2022-11-12 ENCOUNTER — Ambulatory Visit (HOSPITAL_BASED_OUTPATIENT_CLINIC_OR_DEPARTMENT_OTHER)
Admission: RE | Admit: 2022-11-12 | Discharge: 2022-11-12 | Disposition: A | Discharge status: Home / Self Care | Payer: Medicare HMO | Source: Ambulatory Visit | Attending: Family Medicine | Admitting: Family Medicine

## 2022-11-12 DIAGNOSIS — Z1231 Encounter for screening mammogram for malignant neoplasm of breast: Secondary | ICD-10-CM | POA: Insufficient documentation

## 2022-11-12 LAB — H. PYLORI BREATH TEST: H. pylori Breath Test: NOT DETECTED

## 2022-11-30 ENCOUNTER — Telehealth: Payer: Self-pay | Admitting: Family Medicine

## 2022-11-30 NOTE — Telephone Encounter (Signed)
Copied from [place] [phone]. Topic: Medicare AWV >> Nov 30, 2022  2:05 PM Devoria Glassing wrote: Reason for CRM: Called patient to schedule Medicare Annual Wellness Visit (AWV). Left message for patient to call back and schedule Medicare Annual Wellness Visit (AWV).  Last date of AWV: 08/04/2020   Please schedule an appointment at any time with Beatris Ship, [place] .  If any questions, please contact me.  Thank you ,  Sherol Dade; [place] Direct Dial: [phone]

## 2023-04-09 ENCOUNTER — Ambulatory Visit (INDEPENDENT_AMBULATORY_CARE_PROVIDER_SITE_OTHER): Payer: Medicare HMO | Admitting: Emergency Medicine

## 2023-04-09 VITALS — Ht 67.0 in | Wt 166.0 lb

## 2023-04-09 DIAGNOSIS — Q828 Other specified congenital malformations of skin: Secondary | ICD-10-CM | POA: Insufficient documentation

## 2023-04-09 DIAGNOSIS — Z0000 Encounter for general adult medical examination without abnormal findings: Secondary | ICD-10-CM

## 2023-04-09 NOTE — Progress Notes (Signed)
Subjective:   Per patient no change in vitals since last visit; unable to obtain new vitals due to this being a telehealth visit.  Patient was unable to self-report vital signs via telehealth due to a lack of equipment at home.    Sara Martinez is a 67 y.o. female who presents for an Initial Medicare Annual Wellness Visit.  Visit Complete: Virtual  I connected with  Gilford Raid on 04/09/23 by a audio enabled telemedicine application and verified that I am speaking with the correct person using two identifiers.  Patient Location: Home  Provider Location: Home Office  I discussed the limitations of evaluation and management by telemedicine. The patient expressed understanding and agreed to proceed.  Patient Medicare AWV questionnaire was completed by the patient on 04/08/23; I have confirmed that all information answered by patient is correct and no changes since this date.  Review of Systems     Cardiac Risk Factors include: advanced age (>64men, >80 women)     Objective:    Today's Vitals   04/09/23 1528  Weight: 166 lb (75.3 kg)  Height: 5\' 7"  (1.702 m)   Body mass index is 26 kg/m.     04/09/2023    3:36 PM 08/04/2020    2:42 PM 06/17/2015    8:01 AM 06/03/2015   10:51 AM  Advanced Directives  Does Patient Have a Medical Advance Directive? Yes Yes Yes Yes  Type of Advance Directive Living will;Healthcare Power of [employer] Power of [place];Living will Healthcare Power of [employer] of [place];Living will  Does patient want to make changes to medical advance directive? No - Patient declined No - Patient declined  No - Patient declined  Copy of Healthcare Power of Attorney in Chart? No - copy requested   No - copy requested    Current Medications (verified) Outpatient Encounter Medications as of 04/09/2023  Medication Sig   metroNIDAZOLE (METROGEL) 0.75 % gel Apply topically 2 (two) times daily.   omeprazole (PRILOSEC) 20 MG  capsule Take 1 capsule (20 mg total) by mouth daily.   No facility-administered encounter medications on file as of 04/09/2023.    Allergies (verified) Patient has no known allergies.   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   Torus palatinus    Past Surgical History:  Procedure Laterality Date   ABDOMINAL HYSTERECTOMY     patient report 2000 she had and hysterectomy   COLONOSCOPY  2016   COMBINED HYSTERECTOMY ABDOMINAL W/ MMK / BURCH PROCEDURE  2000   CYSTOCELE RECTOCELE   Family History  Problem Relation Age of Onset   Ovarian cancer Mother    Hypertension Mother    Cancer Mother 24       ovarian    Arthritis Mother    Prostate cancer Father    Cancer Father        prostate   Arthritis Father    Heart disease Brother    Cancer Paternal Aunt        breast   Colon cancer Neg Hx    Stomach cancer Neg Hx    Esophageal cancer Neg Hx    Rectal cancer Neg Hx    Colon polyps Neg Hx    Social History   Socioeconomic History   Marital status: Married    Spouse name: Luisa Hart   Number of children:  1   Years of education: Not on file   Highest education level: Not on file  Occupational History   Occupation: nurse-- [employer]    Employer: [employer]  Tobacco Use   Smoking status: Never   Smokeless tobacco: Never  Vaping Use   Vaping status: Never Used  Substance and Sexual Activity   Alcohol use: Yes    Alcohol/week: 4.0 - 5.0 standard drinks of alcohol    Types: 4 - 5 Glasses of wine per week   Drug use: No   Sexual activity: Yes    Partners: Male  Other Topics Concern   Not on file  Social History Narrative   Exercise-- no   Social Determinants of Health   Financial Resource Strain: Low Risk  (04/08/2023)   Overall Financial Resource Strain (CARDIA)    Difficulty of Paying Living Expenses: Not  hard at all  Food Insecurity: No Food Insecurity (04/08/2023)   Hunger Vital Sign    Worried About Running Out of Food in the Last Year: Never true    Ran Out of Food in the Last Year: Never true  Transportation Needs: No Transportation Needs (04/08/2023)   PRAPARE - Administrator, Civil Service (Medical): No    Lack of Transportation (Non-Medical): No  Physical Activity: Sufficiently Active (04/08/2023)   Exercise Vital Sign    Days of Exercise per Week: 5 days    Minutes of Exercise per Session: 30 min  Stress: No Stress Concern Present (04/08/2023)   [employer] of Occupational Health - Occupational Stress Questionnaire    Feeling of Stress : Only a little  Social Connections: Moderately Integrated (04/08/2023)   Social Connection and Isolation Panel [NHANES]    Frequency of Communication with Friends and Family: Once a week    Frequency of Social Gatherings with Friends and Family: Once a week    Attends Religious Services: More than 4 times per year    Active Member of [employer] or Organizations: Yes    Attends Engineer, structural: More than 4 times per year    Marital Status: Married    Tobacco Counseling Counseling given: Not Answered   Clinical Intake:  Pre-visit preparation completed: Yes  Pain : No/denies pain     BMI - recorded: 26 Nutritional Status: BMI 25 -29 Overweight Nutritional Risks: None Diabetes: No  How often do you need to have someone help you when you read instructions, pamphlets, or other written materials from your doctor or pharmacy?: 1 - Never  Interpreter Needed?: No  Information entered by :: Tora Kindred, CMA   Activities of Daily Living    04/08/2023    5:25 PM  In your present state of health, do you have any difficulty performing the following activities:  Hearing? 0  Vision? 0  Difficulty concentrating or making decisions? 0  Walking or climbing stairs? 0  Dressing or bathing? 0  Doing errands, shopping?  0  Preparing Food and eating ? N  Using the Toilet? N  In the past six months, have you accidently leaked urine? N  Do you have problems with loss of bowel control? N  Managing your Medications? N  Managing your Finances? N  Housekeeping or managing your Housekeeping? N    Patient Care Team: Zola Button, Grayling Congress, DO as PCP - General (Family Medicine)  Indicate any recent Medical Services you may have received from other than [facility] providers in the past year (date  may be approximate).     Assessment:   This is a routine wellness examination for Marilynne.  Hearing/Vision screen Hearing Screening - Comments:: Denies hearing loss  Dietary issues and exercise activities discussed:     Goals Addressed               This Visit's Progress     Weight (lb) < 155 lb (70.3 kg) (pt-stated)   166 lb (75.3 kg)     Depression Screen    04/09/2023    3:33 PM 11/09/2022    3:06 PM 11/20/2021    1:10 PM 08/04/2020    2:40 PM 08/04/2020    2:30 PM 07/25/2018    4:59 PM 07/23/2017    5:12 PM  PHQ 2/9 Scores  PHQ - 2 Score 0 0 0 0 0 0 0  PHQ- 9 Score 0   0  0     Fall Risk    04/08/2023    5:25 PM 11/09/2022    3:06 PM 11/20/2021    1:10 PM 08/04/2020    2:44 PM 08/04/2020    2:30 PM  Fall Risk   Falls in the past year? 0 0 0 0 0  Number falls in past yr: 0 0 0 0 0  Injury with Fall? 0 0 0 0 0  Risk for fall due to : No Fall Risks      Follow up Falls prevention discussed Falls evaluation completed Falls evaluation completed  Falls evaluation completed    MEDICARE RISK AT HOME:   TIMED UP AND GO:  Was the test performed? No    Cognitive Function:    08/04/2020    2:41 PM  MMSE - Mini Mental State Exam  Orientation to time 5  Orientation to Place 5  Registration 3  Attention/ Calculation 5  Recall 3  Language- name 2 objects 2  Language- repeat 1  Language- follow 3 step command 3  Language- read & follow direction 1  Write a sentence 1  Copy design 1  Total  score 30        04/09/2023    3:37 PM  6CIT Screen  What Year? 0 points  What month? 0 points  What time? 0 points  Count back from 20 0 points  Months in reverse 0 points  Repeat phrase 0 points  Total Score 0 points    Immunizations Immunization History  Administered Date(s) Administered   Influenza, High Dose Seasonal PF 07/19/2022   Influenza, Seasonal, Injecte, Preservative Fre 07/04/2020   Influenza,inj,Quad PF,6+ Mos 07/04/2020   Influenza-Unspecified 06/18/2017, 06/29/2018, 07/13/2021, 07/19/2022   PFIZER Comirnaty(Gray Top)Covid-19 Tri-Sucrose Vaccine 02/06/2021   PFIZER(Purple Top)SARS-COV-2 Vaccination 09/12/2019, 10/02/2019, 06/14/2020   PPD Test 10/14/2019   Pneumococcal Polysaccharide-23 01/29/1997   Tdap 01/29/1997, 06/11/2016   Zoster Recombinant(Shingrix) 07/23/2017, 12/18/2017    TDAP status: Up to date  Flu Vaccine status: Up to date  Pneumococcal vaccine status: Declined,  Education has been provided regarding the importance of this vaccine but patient still declined. Advised may receive this vaccine at local pharmacy or Health Dept. Aware to provide a copy of the vaccination record if obtained from local pharmacy or Health Dept. Verbalized acceptance and understanding.   Covid-19 vaccine status: Declined, Education has been provided regarding the importance of this vaccine but patient still declined. Advised may receive this vaccine at local pharmacy or Health Dept.or vaccine clinic. Aware to provide a copy of the vaccination record if obtained from local pharmacy or  Health Dept. Verbalized acceptance and understanding.  Qualifies for Shingles Vaccine? Yes   Zostavax completed No   Shingrix Completed?: Yes  Screening Tests Health Maintenance  Topic Date Due   Pneumonia Vaccine 51+ Years old (2 of 2 - PCV) 12/09/2019   COVID-19 Vaccine (5 - 2023-24 season) 05/18/2022   INFLUENZA VACCINE  04/18/2023   DEXA SCAN  12/19/2023   Medicare Annual Wellness  (AWV)  04/08/2024   MAMMOGRAM  11/12/2024   DTaP/Tdap/Td (3 - Td or Tdap) 06/11/2026   Colonoscopy  12/02/2027   Hepatitis C Screening  Completed   Zoster Vaccines- Shingrix  Completed   HPV VACCINES  Aged Out    Health Maintenance  Health Maintenance Due  Topic Date Due   Pneumonia Vaccine 16+ Years old (2 of 2 - PCV) 12/09/2019   COVID-19 Vaccine (5 - 2023-24 season) 05/18/2022    Colorectal cancer screening: Type of screening: Colonoscopy. Completed 12/01/20. Repeat every 7 years  Mammogram status: Completed 11/12/22. Repeat every year  Bone Density status: Completed 12/18/21. Results reflect: Bone density results: OSTEOPOROSIS. Repeat every 2 years.  Lung Cancer Screening: (Low Dose CT Chest recommended if Age 67-80 years, 20 pack-year currently smoking OR have quit w/in 15years.) does not qualify.   Lung Cancer Screening Referral: n/a  Additional Screening:  Hepatitis C Screening: does qualify; Completed 07/25/18   Dental Screening: Recommended annual dental exams for proper oral hygiene  Community Resource Referral / Chronic Care Management: CRR required this visit?  No   CCM required this visit?  No     Plan:     I have personally reviewed and noted the following in the patient's chart:   Medical and social history Use of alcohol, tobacco or illicit drugs  Current medications and supplements including opioid prescriptions. Patient is not currently taking opioid prescriptions. Functional ability and status Nutritional status Physical activity Advanced directives List of other physicians Hospitalizations, surgeries, and ER visits in previous 12 months Vitals Screenings to include cognitive, depression, and falls Referrals and appointments  In addition, I have reviewed and discussed with patient certain preventive protocols, quality metrics, and best practice recommendations. A written personalized care plan for preventive services as well as general  preventive health recommendations were provided to patient.     Tora Kindred, [facility]   04/09/2023   After Visit Summary: (MyChart) Due to this being a telephonic visit, the after visit summary with patients personalized plan was offered to patient via MyChart   Nurse Notes:  Patient needs Prevnar 20 Declines any further Covid19 vaccines.

## 2023-04-09 NOTE — Patient Instructions (Addendum)
Sara Martinez , Thank you for taking time to come for your Medicare Wellness Visit. I appreciate your ongoing commitment to your health goals. Please review the following plan we discussed and let me know if I can assist you in the future.   These are the goals we discussed:  Goals       Weight (lb) < 155 lb (70.3 kg) (pt-stated)        This is a list of the screening recommended for you and due dates:  Health Maintenance  Topic Date Due   Pneumonia Vaccine (2 of 2 - PCV) 12/09/2019   COVID-19 Vaccine (5 - 2023-24 season) 05/18/2022   Flu Shot  04/18/2023   Mammogram  11/13/2023   DEXA scan (bone density measurement)  12/19/2023   Medicare Annual Wellness Visit  04/08/2024   DTaP/Tdap/Td vaccine (3 - Td or Tdap) 06/11/2026   Colon Cancer Screening  12/02/2027   Hepatitis C Screening  Completed   Zoster (Shingles) Vaccine  Completed   HPV Vaccine  Aged Out    Advanced directives: Please bring a copy of your health care power of attorney and living will to the office to be added to your chart at your convenience.   Conditions/risks identified: Recommend the pneumonia vaccine. You can get this at your local pharmacy. Keep up the good work!  Next appointment: Follow up in one year for your annual wellness visit 04/14/24 @ 3pm   Preventive Care 65 Years and Older, Female Preventive care refers to lifestyle choices and visits with your health care provider that can promote health and wellness. What does preventive care include? A yearly physical exam. This is also called an annual well check. Dental exams once or twice a year. Routine eye exams. Ask your health care provider how often you should have your eyes checked. Personal lifestyle choices, including: Daily care of your teeth and gums. Regular physical activity. Eating a healthy diet. Avoiding tobacco and drug use. Limiting alcohol use. Practicing safe sex. Taking low-dose aspirin every day. Taking vitamin and mineral  supplements as recommended by your health care provider. What happens during an annual well check? The services and screenings done by your health care provider during your annual well check will depend on your age, overall health, lifestyle risk factors, and family history of disease. Counseling  Your health care provider may ask you questions about your: Alcohol use. Tobacco use. Drug use. Emotional well-being. Home and relationship well-being. Sexual activity. Eating habits. History of falls. Memory and ability to understand (cognition). Work and work Astronomer. Reproductive health. Screening  You may have the following tests or measurements: Height, weight, and BMI. Blood pressure. Lipid and cholesterol levels. These may be checked every 5 years, or more frequently if you are over 35 years old. Skin check. Lung cancer screening. You may have this screening every year starting at age 51 if you have a 30-pack-year history of smoking and currently smoke or have quit within the past 15 years. Fecal occult blood test (FOBT) of the stool. You may have this test every year starting at age 78. Flexible sigmoidoscopy or colonoscopy. You may have a sigmoidoscopy every 5 years or a colonoscopy every 10 years starting at age 64. Hepatitis C blood test. Hepatitis B blood test. Sexually transmitted disease (STD) testing. Diabetes screening. This is done by checking your blood sugar (glucose) after you have not eaten for a while (fasting). You may have this done every 1-3 years. Bone density scan. This  is done to screen for osteoporosis. You may have this done starting at age 71. Mammogram. This may be done every 1-2 years. Talk to your health care provider about how often you should have regular mammograms. Talk with your health care provider about your test results, treatment options, and if necessary, the need for more tests. Vaccines  Your health care provider may recommend certain  vaccines, such as: Influenza vaccine. This is recommended every year. Tetanus, diphtheria, and acellular pertussis (Tdap, Td) vaccine. You may need a Td booster every 10 years. Zoster vaccine. You may need this after age 83. Pneumococcal 13-valent conjugate (PCV13) vaccine. One dose is recommended after age 63. Pneumococcal polysaccharide (PPSV23) vaccine. One dose is recommended after age 84. Talk to your health care provider about which screenings and vaccines you need and how often you need them. This information is not intended to replace advice given to you by your health care provider. Make sure you discuss any questions you have with your health care provider. Document Released: 09/30/2015 Document Revised: 05/23/2016 Document Reviewed: 07/05/2015 Elsevier Interactive Patient Education  2017 [employer].  Fall Prevention in the Home Falls can cause injuries. They can happen to people of all ages. There are many things you can do to make your home safe and to help prevent falls. What can I do on the outside of my home? Regularly fix the edges of walkways and driveways and fix any cracks. Remove anything that might make you trip as you walk through a door, such as a raised step or threshold. Trim any bushes or trees on the path to your home. Use bright outdoor lighting. Clear any walking paths of anything that might make someone trip, such as rocks or tools. Regularly check to see if handrails are loose or broken. Make sure that both sides of any steps have handrails. Any raised decks and porches should have guardrails on the edges. Have any leaves, snow, or ice cleared regularly. Use sand or salt on walking paths during winter. Clean up any spills in your garage right away. This includes oil or grease spills. What can I do in the bathroom? Use night lights. Install grab bars by the toilet and in the tub and shower. Do not use towel bars as grab bars. Use non-skid mats or decals in  the tub or shower. If you need to sit down in the shower, use a plastic, non-slip stool. Keep the floor dry. Clean up any water that spills on the floor as soon as it happens. Remove soap buildup in the tub or shower regularly. Attach bath mats securely with double-sided non-slip rug tape. Do not have throw rugs and other things on the floor that can make you trip. What can I do in the bedroom? Use night lights. Make sure that you have a light by your bed that is easy to reach. Do not use any sheets or blankets that are too big for your bed. They should not hang down onto the floor. Have a firm chair that has side arms. You can use this for support while you get dressed. Do not have throw rugs and other things on the floor that can make you trip. What can I do in the kitchen? Clean up any spills right away. Avoid walking on wet floors. Keep items that you use a lot in easy-to-reach places. If you need to reach something above you, use a strong step stool that has a grab bar. Keep electrical cords out  of the way. Do not use floor polish or wax that makes floors slippery. If you must use wax, use non-skid floor wax. Do not have throw rugs and other things on the floor that can make you trip. What can I do with my stairs? Do not leave any items on the stairs. Make sure that there are handrails on both sides of the stairs and use them. Fix handrails that are broken or loose. Make sure that handrails are as long as the stairways. Check any carpeting to make sure that it is firmly attached to the stairs. Fix any carpet that is loose or worn. Avoid having throw rugs at the top or bottom of the stairs. If you do have throw rugs, attach them to the floor with carpet tape. Make sure that you have a light switch at the top of the stairs and the bottom of the stairs. If you do not have them, ask someone to add them for you. What else can I do to help prevent falls? Wear shoes that: Do not have high  heels. Have rubber bottoms. Are comfortable and fit you well. Are closed at the toe. Do not wear sandals. If you use a stepladder: Make sure that it is fully opened. Do not climb a closed stepladder. Make sure that both sides of the stepladder are locked into place. Ask someone to hold it for you, if possible. Clearly mark and make sure that you can see: Any grab bars or handrails. First and last steps. Where the edge of each step is. Use tools that help you move around (mobility aids) if they are needed. These include: Canes. Walkers. Scooters. Crutches. Turn on the lights when you go into a dark area. Replace any light bulbs as soon as they burn out. Set up your furniture so you have a clear path. Avoid moving your furniture around. If any of your floors are uneven, fix them. If there are any pets around you, be aware of where they are. Review your medicines with your doctor. Some medicines can make you feel dizzy. This can increase your chance of falling. Ask your doctor what other things that you can do to help prevent falls. This information is not intended to replace advice given to you by your health care provider. Make sure you discuss any questions you have with your health care provider. Document Released: 06/30/2009 Document Revised: 02/09/2016 Document Reviewed: 10/08/2014 Elsevier Interactive Patient Education  2017 [employer].

## 2023-05-15 DIAGNOSIS — M4126 Other idiopathic scoliosis, lumbar region: Secondary | ICD-10-CM | POA: Diagnosis not present

## 2023-05-15 DIAGNOSIS — M5451 Vertebrogenic low back pain: Secondary | ICD-10-CM | POA: Diagnosis not present

## 2023-05-15 DIAGNOSIS — M9903 Segmental and somatic dysfunction of lumbar region: Secondary | ICD-10-CM | POA: Diagnosis not present

## 2023-05-15 DIAGNOSIS — M5136 Other intervertebral disc degeneration, lumbar region: Secondary | ICD-10-CM | POA: Diagnosis not present

## 2023-05-16 DIAGNOSIS — M4126 Other idiopathic scoliosis, lumbar region: Secondary | ICD-10-CM | POA: Diagnosis not present

## 2023-05-16 DIAGNOSIS — M9903 Segmental and somatic dysfunction of lumbar region: Secondary | ICD-10-CM | POA: Diagnosis not present

## 2023-05-16 DIAGNOSIS — M5451 Vertebrogenic low back pain: Secondary | ICD-10-CM | POA: Diagnosis not present

## 2023-05-16 DIAGNOSIS — M5136 Other intervertebral disc degeneration, lumbar region: Secondary | ICD-10-CM | POA: Diagnosis not present

## 2023-05-21 DIAGNOSIS — M4126 Other idiopathic scoliosis, lumbar region: Secondary | ICD-10-CM | POA: Diagnosis not present

## 2023-05-21 DIAGNOSIS — M5451 Vertebrogenic low back pain: Secondary | ICD-10-CM | POA: Diagnosis not present

## 2023-05-21 DIAGNOSIS — M9903 Segmental and somatic dysfunction of lumbar region: Secondary | ICD-10-CM | POA: Diagnosis not present

## 2023-05-21 DIAGNOSIS — M5136 Other intervertebral disc degeneration, lumbar region: Secondary | ICD-10-CM | POA: Diagnosis not present

## 2023-05-22 DIAGNOSIS — M5451 Vertebrogenic low back pain: Secondary | ICD-10-CM | POA: Diagnosis not present

## 2023-05-22 DIAGNOSIS — M9903 Segmental and somatic dysfunction of lumbar region: Secondary | ICD-10-CM | POA: Diagnosis not present

## 2023-05-22 DIAGNOSIS — M4126 Other idiopathic scoliosis, lumbar region: Secondary | ICD-10-CM | POA: Diagnosis not present

## 2023-05-22 DIAGNOSIS — M5136 Other intervertebral disc degeneration, lumbar region: Secondary | ICD-10-CM | POA: Diagnosis not present

## 2023-05-24 DIAGNOSIS — M9903 Segmental and somatic dysfunction of lumbar region: Secondary | ICD-10-CM | POA: Diagnosis not present

## 2023-05-24 DIAGNOSIS — M5451 Vertebrogenic low back pain: Secondary | ICD-10-CM | POA: Diagnosis not present

## 2023-05-24 DIAGNOSIS — M5136 Other intervertebral disc degeneration, lumbar region: Secondary | ICD-10-CM | POA: Diagnosis not present

## 2023-05-24 DIAGNOSIS — M4126 Other idiopathic scoliosis, lumbar region: Secondary | ICD-10-CM | POA: Diagnosis not present

## 2023-05-27 DIAGNOSIS — M4126 Other idiopathic scoliosis, lumbar region: Secondary | ICD-10-CM | POA: Diagnosis not present

## 2023-05-27 DIAGNOSIS — M9903 Segmental and somatic dysfunction of lumbar region: Secondary | ICD-10-CM | POA: Diagnosis not present

## 2023-05-27 DIAGNOSIS — M5136 Other intervertebral disc degeneration, lumbar region: Secondary | ICD-10-CM | POA: Diagnosis not present

## 2023-05-27 DIAGNOSIS — M5451 Vertebrogenic low back pain: Secondary | ICD-10-CM | POA: Diagnosis not present

## 2023-05-29 DIAGNOSIS — M4126 Other idiopathic scoliosis, lumbar region: Secondary | ICD-10-CM | POA: Diagnosis not present

## 2023-05-29 DIAGNOSIS — M9903 Segmental and somatic dysfunction of lumbar region: Secondary | ICD-10-CM | POA: Diagnosis not present

## 2023-05-29 DIAGNOSIS — M5136 Other intervertebral disc degeneration, lumbar region: Secondary | ICD-10-CM | POA: Diagnosis not present

## 2023-05-29 DIAGNOSIS — M5451 Vertebrogenic low back pain: Secondary | ICD-10-CM | POA: Diagnosis not present

## 2023-05-31 DIAGNOSIS — M5451 Vertebrogenic low back pain: Secondary | ICD-10-CM | POA: Diagnosis not present

## 2023-05-31 DIAGNOSIS — M9903 Segmental and somatic dysfunction of lumbar region: Secondary | ICD-10-CM | POA: Diagnosis not present

## 2023-05-31 DIAGNOSIS — M5136 Other intervertebral disc degeneration, lumbar region: Secondary | ICD-10-CM | POA: Diagnosis not present

## 2023-05-31 DIAGNOSIS — M4126 Other idiopathic scoliosis, lumbar region: Secondary | ICD-10-CM | POA: Diagnosis not present

## 2023-06-03 DIAGNOSIS — M5451 Vertebrogenic low back pain: Secondary | ICD-10-CM | POA: Diagnosis not present

## 2023-06-03 DIAGNOSIS — M5136 Other intervertebral disc degeneration, lumbar region: Secondary | ICD-10-CM | POA: Diagnosis not present

## 2023-06-03 DIAGNOSIS — M9903 Segmental and somatic dysfunction of lumbar region: Secondary | ICD-10-CM | POA: Diagnosis not present

## 2023-06-03 DIAGNOSIS — M4126 Other idiopathic scoliosis, lumbar region: Secondary | ICD-10-CM | POA: Diagnosis not present

## 2023-06-05 DIAGNOSIS — M4126 Other idiopathic scoliosis, lumbar region: Secondary | ICD-10-CM | POA: Diagnosis not present

## 2023-06-05 DIAGNOSIS — M5136 Other intervertebral disc degeneration, lumbar region: Secondary | ICD-10-CM | POA: Diagnosis not present

## 2023-06-05 DIAGNOSIS — M9903 Segmental and somatic dysfunction of lumbar region: Secondary | ICD-10-CM | POA: Diagnosis not present

## 2023-06-05 DIAGNOSIS — M5451 Vertebrogenic low back pain: Secondary | ICD-10-CM | POA: Diagnosis not present

## 2023-06-06 DIAGNOSIS — M9903 Segmental and somatic dysfunction of lumbar region: Secondary | ICD-10-CM | POA: Diagnosis not present

## 2023-06-06 DIAGNOSIS — M5136 Other intervertebral disc degeneration, lumbar region: Secondary | ICD-10-CM | POA: Diagnosis not present

## 2023-06-06 DIAGNOSIS — M4126 Other idiopathic scoliosis, lumbar region: Secondary | ICD-10-CM | POA: Diagnosis not present

## 2023-06-06 DIAGNOSIS — M5451 Vertebrogenic low back pain: Secondary | ICD-10-CM | POA: Diagnosis not present

## 2023-06-11 DIAGNOSIS — M9903 Segmental and somatic dysfunction of lumbar region: Secondary | ICD-10-CM | POA: Diagnosis not present

## 2023-06-11 DIAGNOSIS — M5451 Vertebrogenic low back pain: Secondary | ICD-10-CM | POA: Diagnosis not present

## 2023-06-11 DIAGNOSIS — M4126 Other idiopathic scoliosis, lumbar region: Secondary | ICD-10-CM | POA: Diagnosis not present

## 2023-06-11 DIAGNOSIS — M5136 Other intervertebral disc degeneration, lumbar region: Secondary | ICD-10-CM | POA: Diagnosis not present

## 2023-06-12 DIAGNOSIS — M5136 Other intervertebral disc degeneration, lumbar region: Secondary | ICD-10-CM | POA: Diagnosis not present

## 2023-06-12 DIAGNOSIS — M9903 Segmental and somatic dysfunction of lumbar region: Secondary | ICD-10-CM | POA: Diagnosis not present

## 2023-06-12 DIAGNOSIS — M5451 Vertebrogenic low back pain: Secondary | ICD-10-CM | POA: Diagnosis not present

## 2023-06-12 DIAGNOSIS — M4126 Other idiopathic scoliosis, lumbar region: Secondary | ICD-10-CM | POA: Diagnosis not present

## 2023-06-14 DIAGNOSIS — M9903 Segmental and somatic dysfunction of lumbar region: Secondary | ICD-10-CM | POA: Diagnosis not present

## 2023-06-14 DIAGNOSIS — M5136 Other intervertebral disc degeneration, lumbar region: Secondary | ICD-10-CM | POA: Diagnosis not present

## 2023-06-14 DIAGNOSIS — M4126 Other idiopathic scoliosis, lumbar region: Secondary | ICD-10-CM | POA: Diagnosis not present

## 2023-06-14 DIAGNOSIS — M5451 Vertebrogenic low back pain: Secondary | ICD-10-CM | POA: Diagnosis not present

## 2023-06-17 DIAGNOSIS — M5136 Other intervertebral disc degeneration, lumbar region: Secondary | ICD-10-CM | POA: Diagnosis not present

## 2023-06-17 DIAGNOSIS — M4126 Other idiopathic scoliosis, lumbar region: Secondary | ICD-10-CM | POA: Diagnosis not present

## 2023-06-17 DIAGNOSIS — M5451 Vertebrogenic low back pain: Secondary | ICD-10-CM | POA: Diagnosis not present

## 2023-06-17 DIAGNOSIS — M9903 Segmental and somatic dysfunction of lumbar region: Secondary | ICD-10-CM | POA: Diagnosis not present

## 2023-06-19 DIAGNOSIS — M9903 Segmental and somatic dysfunction of lumbar region: Secondary | ICD-10-CM | POA: Diagnosis not present

## 2023-06-19 DIAGNOSIS — M4126 Other idiopathic scoliosis, lumbar region: Secondary | ICD-10-CM | POA: Diagnosis not present

## 2023-06-19 DIAGNOSIS — M62830 Muscle spasm of back: Secondary | ICD-10-CM | POA: Diagnosis not present

## 2023-06-19 DIAGNOSIS — M5451 Vertebrogenic low back pain: Secondary | ICD-10-CM | POA: Diagnosis not present

## 2023-06-25 DIAGNOSIS — M4126 Other idiopathic scoliosis, lumbar region: Secondary | ICD-10-CM | POA: Diagnosis not present

## 2023-06-25 DIAGNOSIS — M62830 Muscle spasm of back: Secondary | ICD-10-CM | POA: Diagnosis not present

## 2023-06-25 DIAGNOSIS — M5451 Vertebrogenic low back pain: Secondary | ICD-10-CM | POA: Diagnosis not present

## 2023-06-25 DIAGNOSIS — M9903 Segmental and somatic dysfunction of lumbar region: Secondary | ICD-10-CM | POA: Diagnosis not present

## 2023-06-27 DIAGNOSIS — M62830 Muscle spasm of back: Secondary | ICD-10-CM | POA: Diagnosis not present

## 2023-06-27 DIAGNOSIS — M9903 Segmental and somatic dysfunction of lumbar region: Secondary | ICD-10-CM | POA: Diagnosis not present

## 2023-06-27 DIAGNOSIS — M5451 Vertebrogenic low back pain: Secondary | ICD-10-CM | POA: Diagnosis not present

## 2023-06-27 DIAGNOSIS — M4126 Other idiopathic scoliosis, lumbar region: Secondary | ICD-10-CM | POA: Diagnosis not present

## 2023-07-01 DIAGNOSIS — M9903 Segmental and somatic dysfunction of lumbar region: Secondary | ICD-10-CM | POA: Diagnosis not present

## 2023-07-01 DIAGNOSIS — M5451 Vertebrogenic low back pain: Secondary | ICD-10-CM | POA: Diagnosis not present

## 2023-07-01 DIAGNOSIS — M62830 Muscle spasm of back: Secondary | ICD-10-CM | POA: Diagnosis not present

## 2023-07-01 DIAGNOSIS — M4126 Other idiopathic scoliosis, lumbar region: Secondary | ICD-10-CM | POA: Diagnosis not present

## 2023-07-03 DIAGNOSIS — M9903 Segmental and somatic dysfunction of lumbar region: Secondary | ICD-10-CM | POA: Diagnosis not present

## 2023-07-03 DIAGNOSIS — M5451 Vertebrogenic low back pain: Secondary | ICD-10-CM | POA: Diagnosis not present

## 2023-07-03 DIAGNOSIS — M62830 Muscle spasm of back: Secondary | ICD-10-CM | POA: Diagnosis not present

## 2023-07-03 DIAGNOSIS — M4126 Other idiopathic scoliosis, lumbar region: Secondary | ICD-10-CM | POA: Diagnosis not present

## 2023-07-10 DIAGNOSIS — M9903 Segmental and somatic dysfunction of lumbar region: Secondary | ICD-10-CM | POA: Diagnosis not present

## 2023-07-10 DIAGNOSIS — M4126 Other idiopathic scoliosis, lumbar region: Secondary | ICD-10-CM | POA: Diagnosis not present

## 2023-07-10 DIAGNOSIS — M5451 Vertebrogenic low back pain: Secondary | ICD-10-CM | POA: Diagnosis not present

## 2023-07-10 DIAGNOSIS — M62830 Muscle spasm of back: Secondary | ICD-10-CM | POA: Diagnosis not present

## 2023-07-11 DIAGNOSIS — M4126 Other idiopathic scoliosis, lumbar region: Secondary | ICD-10-CM | POA: Diagnosis not present

## 2023-07-11 DIAGNOSIS — M5451 Vertebrogenic low back pain: Secondary | ICD-10-CM | POA: Diagnosis not present

## 2023-07-11 DIAGNOSIS — M9903 Segmental and somatic dysfunction of lumbar region: Secondary | ICD-10-CM | POA: Diagnosis not present

## 2023-07-11 DIAGNOSIS — M62830 Muscle spasm of back: Secondary | ICD-10-CM | POA: Diagnosis not present

## 2023-07-17 DIAGNOSIS — M5451 Vertebrogenic low back pain: Secondary | ICD-10-CM | POA: Diagnosis not present

## 2023-07-17 DIAGNOSIS — M62830 Muscle spasm of back: Secondary | ICD-10-CM | POA: Diagnosis not present

## 2023-07-17 DIAGNOSIS — M9903 Segmental and somatic dysfunction of lumbar region: Secondary | ICD-10-CM | POA: Diagnosis not present

## 2023-07-17 DIAGNOSIS — M4126 Other idiopathic scoliosis, lumbar region: Secondary | ICD-10-CM | POA: Diagnosis not present

## 2023-07-19 DIAGNOSIS — M9903 Segmental and somatic dysfunction of lumbar region: Secondary | ICD-10-CM | POA: Diagnosis not present

## 2023-07-19 DIAGNOSIS — M5451 Vertebrogenic low back pain: Secondary | ICD-10-CM | POA: Diagnosis not present

## 2023-07-19 DIAGNOSIS — M62830 Muscle spasm of back: Secondary | ICD-10-CM | POA: Diagnosis not present

## 2023-07-19 DIAGNOSIS — M4126 Other idiopathic scoliosis, lumbar region: Secondary | ICD-10-CM | POA: Diagnosis not present

## 2023-07-24 DIAGNOSIS — M4126 Other idiopathic scoliosis, lumbar region: Secondary | ICD-10-CM | POA: Diagnosis not present

## 2023-07-24 DIAGNOSIS — M9903 Segmental and somatic dysfunction of lumbar region: Secondary | ICD-10-CM | POA: Diagnosis not present

## 2023-07-24 DIAGNOSIS — M62830 Muscle spasm of back: Secondary | ICD-10-CM | POA: Diagnosis not present

## 2023-07-24 DIAGNOSIS — M5451 Vertebrogenic low back pain: Secondary | ICD-10-CM | POA: Diagnosis not present

## 2023-07-30 DIAGNOSIS — M62830 Muscle spasm of back: Secondary | ICD-10-CM | POA: Diagnosis not present

## 2023-07-30 DIAGNOSIS — M5451 Vertebrogenic low back pain: Secondary | ICD-10-CM | POA: Diagnosis not present

## 2023-07-30 DIAGNOSIS — M4126 Other idiopathic scoliosis, lumbar region: Secondary | ICD-10-CM | POA: Diagnosis not present

## 2023-07-30 DIAGNOSIS — M9903 Segmental and somatic dysfunction of lumbar region: Secondary | ICD-10-CM | POA: Diagnosis not present

## 2023-07-31 DIAGNOSIS — M62830 Muscle spasm of back: Secondary | ICD-10-CM | POA: Diagnosis not present

## 2023-07-31 DIAGNOSIS — M5451 Vertebrogenic low back pain: Secondary | ICD-10-CM | POA: Diagnosis not present

## 2023-07-31 DIAGNOSIS — M9903 Segmental and somatic dysfunction of lumbar region: Secondary | ICD-10-CM | POA: Diagnosis not present

## 2023-07-31 DIAGNOSIS — M4126 Other idiopathic scoliosis, lumbar region: Secondary | ICD-10-CM | POA: Diagnosis not present

## 2023-08-06 DIAGNOSIS — M62830 Muscle spasm of back: Secondary | ICD-10-CM | POA: Diagnosis not present

## 2023-08-06 DIAGNOSIS — M9903 Segmental and somatic dysfunction of lumbar region: Secondary | ICD-10-CM | POA: Diagnosis not present

## 2023-08-06 DIAGNOSIS — M4126 Other idiopathic scoliosis, lumbar region: Secondary | ICD-10-CM | POA: Diagnosis not present

## 2023-08-06 DIAGNOSIS — M5451 Vertebrogenic low back pain: Secondary | ICD-10-CM | POA: Diagnosis not present

## 2023-08-11 ENCOUNTER — Ambulatory Visit: Payer: Medicare HMO

## 2023-08-12 ENCOUNTER — Ambulatory Visit (INDEPENDENT_AMBULATORY_CARE_PROVIDER_SITE_OTHER): Payer: Medicare HMO | Admitting: Family Medicine

## 2023-08-12 VITALS — BP 124/70 | HR 69 | Temp 98.6°F | Resp 16 | Ht 67.0 in | Wt 162.4 lb

## 2023-08-12 DIAGNOSIS — R1013 Epigastric pain: Secondary | ICD-10-CM

## 2023-08-12 DIAGNOSIS — K219 Gastro-esophageal reflux disease without esophagitis: Secondary | ICD-10-CM

## 2023-08-12 MED ORDER — LIDOCAINE VISCOUS HCL 2 % MT SOLN
15.0000 mL | Freq: Once | OROMUCOSAL | Status: AC
  Administered 2023-08-12: 15 mL via OROMUCOSAL

## 2023-08-12 MED ORDER — HYOSCYAMINE SULFATE 0.125 MG SL SUBL
0.1250 mg | SUBLINGUAL_TABLET | Freq: Once | SUBLINGUAL | Status: AC
  Administered 2023-08-12: 0.125 mg via SUBLINGUAL

## 2023-08-12 MED ORDER — ALUM & MAG HYDROXIDE-SIMETH 200-200-20 MG/5ML PO SUSP
30.0000 mL | Freq: Once | ORAL | Status: AC
  Administered 2023-08-12: 30 mL via ORAL

## 2023-08-12 MED ORDER — ONDANSETRON HCL 4 MG PO TABS: 4.0000 mg | ORAL_TABLET | Freq: Three times a day (TID) | ORAL | 0 refills | Status: DC | PRN

## 2023-08-12 MED ORDER — PANTOPRAZOLE SODIUM 40 MG PO TBEC: 40.0000 mg | DELAYED_RELEASE_TABLET | Freq: Every day | ORAL | 3 refills | Status: AC

## 2023-08-12 NOTE — Patient Instructions (Signed)
Abdominal Pain, Adult  Pain in the abdomen (abdominal pain) can be caused by many things. In most cases, it gets better with no treatment or by being treated at home. But in some cases, it can be serious. Your health care provider will ask questions about your medical history and do a physical exam to try to figure out what is causing your pain. Follow these instructions at home: Medicines Take over-the-counter and prescription medicines only as told by your provider. Do not take medicines that help you poop (laxatives) unless told by your provider. General instructions Watch your condition for any changes. Drink enough fluid to keep your pee (urine) pale yellow. Contact a health care provider if: Your pain changes, gets worse, or lasts longer than expected. You have severe cramping or bloating in your abdomen, or you vomit. Your pain gets worse with meals, after eating, or with certain foods. You are constipated or have diarrhea for more than 2-3 days. You are not hungry, or you lose weight without trying. You have signs of dehydration. These may include: Dark pee, very little pee, or no pee. Cracked lips or dry mouth. Sleepiness or weakness. You have pain when you pee (urinate) or poop. Your abdominal pain wakes you up at night. You have blood in your pee. You have a fever. Get help right away if: You cannot stop vomiting. Your pain is only in one part of the abdomen. Pain on the right side could be caused by appendicitis. You have bloody or black poop (stool), or poop that looks like tar. You have trouble breathing. You have chest pain. These symptoms may be an emergency. Get help right away. Call 911. Do not wait to see if the symptoms will go away. Do not drive yourself to the hospital. This information is not intended to replace advice given to you by your health care provider. Make sure you discuss any questions you have with your health care provider. Document Revised:  06/20/2022 Document Reviewed: 06/20/2022 Elsevier Patient Education  2024 [employer]  

## 2023-08-12 NOTE — Progress Notes (Signed)
Established Patient Office Visit  Subjective   Patient ID: Sara Martinez, female    DOB: [birth year]  Age: 67 y.o. MRN: [id number]  Chief Complaint  Patient presents with   Abdominal Pain    Sxs started Wednesday night last week. Pt states having three episodes of abdominal pain and nausea. No appetite. No diarrhea.     HPI Discussed the use of AI scribe software for clinical note transcription with the patient, who gave verbal consent to proceed.  History of Present Illness   The patient, with a family history of gallstones, presented with a four-day history of abdominal pain and nausea. The pain, located in the epigastric region and radiating to the back, began on Thursday night after consuming brussel sprouts. Despite attempts to alleviate the pain with antacids, the discomfort persisted for several hours. The following day, the patient experienced a similar episode of pain a few hours after eating a grilled cheese sandwich and tomato soup. The pain subsided after consuming a meal of green beans and boiled potatoes. On Saturday, the patient reported severe pain that kept her awake all night, starting about two hours after eating chicken pie. The patient also reported persistent nausea and has abstained from eating for the past two days due to fear of exacerbating the pain.  The patient had a similar episode in February, which was managed with Prilosec. The patient reported that the current symptoms are different from the previous episode, as she was not accompanied by sickness. The patient denied any other symptoms such as fever or diarrhea. The patient's pain was exacerbated by palpation of the epigastric region. The patient was not on any medication at the time of the consultation.      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   Torus palatinus    Past Surgical History:  Procedure Laterality Date   ABDOMINAL HYSTERECTOMY     patient report 2000 she had and hysterectomy   COLONOSCOPY  2016   COMBINED HYSTERECTOMY ABDOMINAL W/ MMK / BURCH PROCEDURE  2000   CYSTOCELE RECTOCELE   Social History   Tobacco Use   Smoking status: Never   Smokeless tobacco: Never  Vaping Use   Vaping status: Never Used  Substance Use Topics   Alcohol use: Yes    Alcohol/week: 4.0 - 5.0 standard drinks of alcohol    Types: 4 - 5 Glasses of wine per week   Drug use: No   Social History   Socioeconomic History   Marital status: Married    Spouse name: Luisa Hart   Number of children: 1   Years of education: Not on file   Highest education level: Bachelor's degree (e.g., BA, AB, BS)  Occupational History   Occupation: Engineer, civil (consulting)-- [employer]    Employer: [employer]  Tobacco Use   Smoking status: Never   Smokeless tobacco: Never  Vaping Use   Vaping status:  Never Used  Substance and Sexual Activity   Alcohol use: Yes    Alcohol/week: 4.0 - 5.0 standard drinks of alcohol    Types: 4 - 5 Glasses of wine per week   Drug use: No   Sexual activity: Yes    Partners: Male  Other Topics Concern   Not on file  Social History Narrative   Exercise-- no   Social Determinants of Health   Financial Resource Strain: Low Risk  (08/12/2023)   Overall Financial Resource Strain (CARDIA)    Difficulty of Paying Living Expenses: Not hard at all  Food Insecurity: No Food Insecurity  (08/12/2023)   Hunger Vital Sign    Worried About Running Out of Food in the Last Year: Never true    Ran Out of Food in the Last Year: Never true  Transportation Needs: No Transportation Needs (08/12/2023)   PRAPARE - Administrator, Civil Service (Medical): No    Lack of Transportation (Non-Medical): No  Physical Activity: Insufficiently Active (08/12/2023)   Exercise Vital Sign    Days of Exercise per Week: 3 days    Minutes of Exercise per Session: 30 min  Stress: No Stress Concern Present (08/12/2023)   [employer] of Occupational Health - Occupational Stress Questionnaire    Feeling of Stress : Only a little  Social Connections: Moderately Integrated (08/12/2023)   Social Connection and Isolation Panel [NHANES]    Frequency of Communication with Friends and Family: Once a week    Frequency of Social Gatherings with Friends and Family: Once a week    Attends Religious Services: More than 4 times per year    Active Member of [employer] or Organizations: Yes    Attends Engineer, structural: More than 4 times per year    Marital Status: Married  Catering manager Violence: Not At Risk (04/09/2023)   Humiliation, Afraid, Rape, and Kick questionnaire    Fear of Current or Ex-Partner: No    Emotionally Abused: No    Physically Abused: No    Sexually Abused: No   Family Status  Relation Name Status   Mother  Deceased at age 46       ovarian   Father  Deceased at age 68       pneumonia   Brother  Alive   Pat Aunt  Deceased   Neg Hx  (Not Specified)  No partnership data on file   Family History  Problem Relation Age of Onset   Ovarian cancer Mother    Hypertension Mother    Cancer Mother 32       ovarian    Arthritis Mother    Prostate cancer Father    Cancer Father        prostate   Arthritis Father    Heart disease Brother    Cancer Paternal Aunt        breast   Colon cancer Neg Hx    Stomach cancer Neg Hx    Esophageal cancer Neg Hx     Rectal cancer Neg Hx    Colon polyps Neg Hx    No Known Allergies    Review of Systems  Constitutional:  Negative for chills, fever and malaise/fatigue.  HENT:  Negative for congestion and hearing loss.   Eyes:  Negative for blurred vision and discharge.  Respiratory:  Negative for cough, sputum production and shortness of breath.   Cardiovascular:  Negative for chest pain, palpitations and leg swelling.  Gastrointestinal:  Positive for abdominal pain, heartburn and nausea. Negative for blood in stool, constipation, diarrhea and vomiting.  Genitourinary:  Negative for dysuria, frequency, hematuria and urgency.  Musculoskeletal:  Negative for back pain, falls and myalgias.  Skin:  Negative for rash.  Neurological:  Negative for dizziness, sensory change, loss of consciousness, weakness and headaches.  Endo/Heme/Allergies:  Negative for environmental allergies. Does not bruise/bleed easily.  Psychiatric/Behavioral:  Negative for depression and suicidal ideas. The patient is not nervous/anxious and does not have insomnia.       Objective:     BP 124/70 (BP Location: Right Arm, Patient Position: Sitting, Cuff Size: Normal)   Pulse 69   Temp 98.6 F (37 C) (Oral)   Resp 16   Ht 5\' 7"  (1.702 m)   Wt 162 lb 6.4 oz (73.7 kg)   SpO2 98%   BMI 25.44 kg/m  BP Readings from Last 3 Encounters:  08/12/23 124/70  11/09/22 138/80  11/20/21 110/70   Wt Readings from Last 3 Encounters:  08/12/23 162 lb 6.4 oz (73.7 kg)  04/09/23 166 lb (75.3 kg)  11/09/22 166 lb (75.3 kg)   SpO2 Readings from Last 3 Encounters:  08/12/23 98%  11/09/22 99%  11/20/21 100%      Physical Exam Vitals and nursing note reviewed.  Constitutional:      General: She is not in acute distress.    Appearance: Normal appearance. She is well-developed.  HENT:     Head: Normocephalic and atraumatic.     Right Ear: Tympanic membrane, ear canal and external ear normal. There is no impacted cerumen.     Left  Ear: Tympanic membrane, ear canal and external ear normal. There is no impacted cerumen.     Nose: Nose normal.     Mouth/Throat:     Mouth: Mucous membranes are moist.     Pharynx: Oropharynx is clear. No oropharyngeal exudate or posterior oropharyngeal erythema.  Eyes:     General: No scleral icterus.       Right eye: No discharge.        Left eye: No discharge.     Conjunctiva/sclera: Conjunctivae normal.     Pupils: Pupils are equal, round, and reactive to light.  Neck:     Thyroid: No thyromegaly or thyroid tenderness.     Vascular: No JVD.  Cardiovascular:     Rate and Rhythm: Normal rate and regular rhythm.     Heart sounds: Normal heart sounds. No murmur heard. Pulmonary:     Effort: Pulmonary effort is normal. No respiratory distress.     Breath sounds: Normal breath sounds.  Abdominal:     General: Bowel sounds are normal. There is no distension.     Palpations: Abdomen is soft. There is no mass.     Tenderness: There is abdominal tenderness in the epigastric area. There is no guarding or rebound.  Genitourinary:    Vagina: Normal.  Musculoskeletal:        General: Normal range of motion.     Cervical back: Normal range of motion and neck supple.     Right lower leg: No edema.     Left lower leg: No edema.  Lymphadenopathy:     Cervical: No cervical adenopathy.  Skin:    General: Skin is warm and dry.     Findings: No erythema or rash.  Neurological:     Mental Status: She is alert and oriented to person, place, and time.     Cranial  Nerves: No cranial nerve deficit.     Deep Tendon Reflexes: Reflexes are normal and symmetric.  Psychiatric:        Mood and Affect: Mood normal.        Behavior: Behavior normal.        Thought Content: Thought content normal.        Judgment: Judgment normal.      No results found for any visits on 08/12/23.  Last CBC Lab Results  Component Value Date   WBC 8.6 11/09/2022   HGB 13.6 11/09/2022   HCT 39.9 11/09/2022    MCV 84.4 11/09/2022   MCH 28.8 11/09/2022   RDW 12.6 11/09/2022   PLT 135 (L) 11/09/2022   Last metabolic panel Lab Results  Component Value Date   GLUCOSE 91 11/09/2022   NA 141 11/09/2022   K 4.2 11/09/2022   CL 105 11/09/2022   CO2 27 11/09/2022   BUN 14 11/09/2022   CREATININE 0.97 11/09/2022   GFR 76.50 11/13/2021   CALCIUM 9.1 11/09/2022   PROT 6.6 11/09/2022   ALBUMIN 4.4 11/13/2021   BILITOT 0.5 11/09/2022   ALKPHOS 70 11/13/2021   AST 16 11/09/2022   ALT 13 11/09/2022   Last lipids Lab Results  Component Value Date   CHOL 205 (H) 11/09/2022   HDL 74 11/09/2022   LDLCALC 99 11/09/2022   TRIG 199 (H) 11/09/2022   CHOLHDL 2.8 11/09/2022   Last hemoglobin A1c No results found for: "HGBA1C" Last thyroid functions Lab Results  Component Value Date   TSH 1.00 07/25/2018   Last vitamin D No results found for: "25OHVITD2", "25OHVITD3", "VD25OH" Last vitamin B12 and Folate No results found for: "VITAMINB12", "FOLATE"    The 10-year ASCVD risk score (Arnett DK, et al., 2019) is: 6.7%    Assessment & Plan:   Problem List Items Addressed This Visit   None Visit Diagnoses     Epigastric pain    -  Primary   Relevant Medications   alum & mag hydroxide-simeth (MAALOX/MYLANTA) 200-200-20 MG/5ML suspension 30 mL (Completed)   lidocaine (XYLOCAINE) 2 % viscous mouth solution 15 mL (Completed)   hyoscyamine (LEVSIN SL) SL tablet 0.125 mg (Completed)   Other Relevant Orders   Amylase   Lipase   CBC with Differential/Platelet   Comprehensive metabolic panel   Gastroesophageal reflux disease, unspecified whether esophagitis present       Relevant Medications   pantoprazole (PROTONIX) 40 MG tablet   ondansetron (ZOFRAN) 4 MG tablet   alum & mag hydroxide-simeth (MAALOX/MYLANTA) 200-200-20 MG/5ML suspension 30 mL (Completed)   lidocaine (XYLOCAINE) 2 % viscous mouth solution 15 mL (Completed)   hyoscyamine (LEVSIN SL) SL tablet 0.125 mg (Completed)   Other  Relevant Orders   US Abdomen Limited RUQ (LIVER/GB)     Assessment and Plan    Epigastric Pain   She experiences intermittent epigastric pain radiating to the back, worsened by food intake and unresponsive to antacids, suggesting differential diagnoses of GERD, peptic ulcer disease, and gallstones. Despite a negative H. pylori test in February, her pain severely impacts eating habits. We discussed the potential need for a gastroenterologist referral if the ultrasound is inconclusive and explained that the ultrasound aims to identify gallstones, possibly requiring surgery. We also covered the risks of untreated GERD and peptic ulcer disease, including esophageal damage and ulcer perforation. We will administer oral medication for reflux, order blood work including a repeat H. pylori test, and prescribe Protonix once daily and Zofran for  nausea. A bland diet avoiding fatty, spicy, and acidic foods is advised. An abdominal ultrasound will evaluate for gallstones, with a referral to a gastroenterologist pending inconclusive results.  Nausea   Her persistent nausea, likely secondary to epigastric pain and potential gastrointestinal pathology, has led to a two-day avoidance of food due to fear of symptom exacerbation. We emphasized the importance of maintaining nutrition and hydration. Zofran is prescribed for nausea, alongside oral medication for reflux, and a bland diet is recommended.  Follow-up   An abdominal ultrasound is scheduled with a follow-up on blood work results. She is advised to contact the office if symptoms worsen or if there is no communication regarding results within a reasonable timeframe. A referral to a surgeon or gastroenterologist will be considered based on the ultrasound findings.        Return if symptoms worsen or fail to improve.    Donato Schultz, DO

## 2023-08-13 LAB — COMPREHENSIVE METABOLIC PANEL
ALT: 22 U/L (ref 0–35)
AST: 72 U/L — ABNORMAL HIGH (ref 0–37)
Albumin: 4.2 g/dL (ref 3.5–5.2)
Alkaline Phosphatase: 58 U/L (ref 39–117)
BUN: 11 mg/dL (ref 6–23)
CO2: 29 meq/L (ref 19–32)
Calcium: 9 mg/dL (ref 8.4–10.5)
Chloride: 99 meq/L (ref 96–112)
Creatinine, Ser: 0.78 mg/dL (ref 0.40–1.20)
GFR: 77.9 mL/min (ref >60.00)
Glucose, Bld: 97 mg/dL (ref 70–99)
Potassium: 3.6 meq/L (ref 3.5–5.1)
Sodium: 136 meq/L (ref 135–145)
Total Bilirubin: 1 mg/dL (ref 0.2–1.2)
Total Protein: 6.7 g/dL (ref 6.0–8.3)

## 2023-08-13 LAB — CBC WITH DIFFERENTIAL/PLATELET
Basophils Absolute: 0.1 10*3/uL (ref 0.0–0.1)
Basophils Relative: 0.9 % (ref 0.0–3.0)
Eosinophils Absolute: 0 10*3/uL (ref 0.0–0.7)
Eosinophils Relative: 0.3 % (ref 0.0–5.0)
HCT: 44.2 % (ref 36.0–46.0)
Hemoglobin: 14.6 g/dL (ref 12.0–15.0)
Lymphocytes Relative: 12.5 % (ref 12.0–46.0)
Lymphs Abs: 1.6 10*3/uL (ref 0.7–4.0)
MCHC: 33.1 g/dL (ref 30.0–36.0)
MCV: 88.9 fL (ref 78.0–100.0)
Monocytes Absolute: 1.3 10*3/uL — ABNORMAL HIGH (ref 0.1–1.0)
Monocytes Relative: 10.1 % (ref 3.0–12.0)
Neutro Abs: 9.9 10*3/uL — ABNORMAL HIGH (ref 1.4–7.7)
Neutrophils Relative %: 76.2 % (ref 43.0–77.0)
Platelets: 130 10*3/uL — ABNORMAL LOW (ref 150.0–400.0)
RBC: 4.97 Mil/uL (ref 3.87–5.11)
RDW: 13.5 % (ref 11.5–15.5)
WBC: 13 10*3/uL — ABNORMAL HIGH (ref 4.0–10.5)

## 2023-08-13 LAB — LIPASE: Lipase: 33 U/L (ref 11.0–59.0)

## 2023-08-13 LAB — AMYLASE: Amylase: 56 U/L (ref 27–131)

## 2023-08-14 DIAGNOSIS — M62830 Muscle spasm of back: Secondary | ICD-10-CM | POA: Diagnosis not present

## 2023-08-14 DIAGNOSIS — M4126 Other idiopathic scoliosis, lumbar region: Secondary | ICD-10-CM | POA: Diagnosis not present

## 2023-08-14 DIAGNOSIS — M5451 Vertebrogenic low back pain: Secondary | ICD-10-CM | POA: Diagnosis not present

## 2023-08-14 DIAGNOSIS — M9903 Segmental and somatic dysfunction of lumbar region: Secondary | ICD-10-CM | POA: Diagnosis not present

## 2023-08-19 ENCOUNTER — Other Ambulatory Visit: Payer: Self-pay | Admitting: Family Medicine

## 2023-08-20 ENCOUNTER — Encounter: Payer: Self-pay | Admitting: Family Medicine

## 2023-08-21 ENCOUNTER — Other Ambulatory Visit: Payer: Self-pay | Admitting: Family Medicine

## 2023-08-21 ENCOUNTER — Ambulatory Visit (HOSPITAL_BASED_OUTPATIENT_CLINIC_OR_DEPARTMENT_OTHER)
Admission: RE | Admit: 2023-08-21 | Discharge: 2023-08-21 | Disposition: A | Discharge status: Home / Self Care | Payer: Medicare HMO | Source: Ambulatory Visit | Attending: Family Medicine | Admitting: Family Medicine

## 2023-08-21 ENCOUNTER — Other Ambulatory Visit: Payer: Self-pay

## 2023-08-21 ENCOUNTER — Other Ambulatory Visit (INDEPENDENT_AMBULATORY_CARE_PROVIDER_SITE_OTHER): Payer: Medicare HMO

## 2023-08-21 DIAGNOSIS — R1013 Epigastric pain: Secondary | ICD-10-CM

## 2023-08-21 DIAGNOSIS — M62830 Muscle spasm of back: Secondary | ICD-10-CM | POA: Diagnosis not present

## 2023-08-21 DIAGNOSIS — M4126 Other idiopathic scoliosis, lumbar region: Secondary | ICD-10-CM | POA: Diagnosis not present

## 2023-08-21 DIAGNOSIS — D72829 Elevated white blood cell count, unspecified: Secondary | ICD-10-CM

## 2023-08-21 DIAGNOSIS — M5451 Vertebrogenic low back pain: Secondary | ICD-10-CM | POA: Diagnosis not present

## 2023-08-21 DIAGNOSIS — K219 Gastro-esophageal reflux disease without esophagitis: Secondary | ICD-10-CM | POA: Diagnosis not present

## 2023-08-21 DIAGNOSIS — R160 Hepatomegaly, not elsewhere classified: Secondary | ICD-10-CM | POA: Diagnosis not present

## 2023-08-21 DIAGNOSIS — M9903 Segmental and somatic dysfunction of lumbar region: Secondary | ICD-10-CM | POA: Diagnosis not present

## 2023-08-21 LAB — CBC WITH DIFFERENTIAL/PLATELET
Basophils Absolute: 0 10*3/uL (ref 0.0–0.1)
Basophils Relative: 0.5 % (ref 0.0–3.0)
Eosinophils Absolute: 0 10*3/uL (ref 0.0–0.7)
Eosinophils Relative: 0.7 % (ref 0.0–5.0)
HCT: 41.7 % (ref 36.0–46.0)
Hemoglobin: 13.9 g/dL (ref 12.0–15.0)
Lymphocytes Relative: 21.2 % (ref 12.0–46.0)
Lymphs Abs: 1.4 10*3/uL (ref 0.7–4.0)
MCHC: 33.3 g/dL (ref 30.0–36.0)
MCV: 87.4 fL (ref 78.0–100.0)
Monocytes Absolute: 0.6 10*3/uL (ref 0.1–1.0)
Monocytes Relative: 9 % (ref 3.0–12.0)
Neutro Abs: 4.5 10*3/uL (ref 1.4–7.7)
Neutrophils Relative %: 68.6 % (ref 43.0–77.0)
Platelets: 115 10*3/uL — ABNORMAL LOW (ref 150.0–400.0)
RBC: 4.77 Mil/uL (ref 3.87–5.11)
RDW: 13.1 % (ref 11.5–15.5)
WBC: 6.6 10*3/uL (ref 4.0–10.5)

## 2023-08-21 NOTE — Telephone Encounter (Signed)
I tried to go into the order to change to stat but was unable. Do I need to order a new ultrasound?

## 2023-08-22 ENCOUNTER — Telehealth (HOSPITAL_BASED_OUTPATIENT_CLINIC_OR_DEPARTMENT_OTHER): Payer: Self-pay

## 2023-08-23 NOTE — Telephone Encounter (Signed)
Imaging schedule for 12/18

## 2023-08-26 LAB — H. PYLORI BREATH TEST: H. pylori Breath Test: NOT DETECTED

## 2023-08-28 DIAGNOSIS — M9903 Segmental and somatic dysfunction of lumbar region: Secondary | ICD-10-CM | POA: Diagnosis not present

## 2023-08-28 DIAGNOSIS — M62830 Muscle spasm of back: Secondary | ICD-10-CM | POA: Diagnosis not present

## 2023-08-28 DIAGNOSIS — M4126 Other idiopathic scoliosis, lumbar region: Secondary | ICD-10-CM | POA: Diagnosis not present

## 2023-08-28 DIAGNOSIS — M5451 Vertebrogenic low back pain: Secondary | ICD-10-CM | POA: Diagnosis not present

## 2023-09-03 ENCOUNTER — Ambulatory Visit: Payer: Medicare HMO | Admitting: Internal Medicine

## 2023-09-03 ENCOUNTER — Encounter: Payer: Self-pay | Admitting: Internal Medicine

## 2023-09-03 VITALS — BP 120/80 | HR 68 | Ht 66.0 in | Wt 165.4 lb

## 2023-09-03 DIAGNOSIS — K769 Liver disease, unspecified: Secondary | ICD-10-CM

## 2023-09-03 DIAGNOSIS — R110 Nausea: Secondary | ICD-10-CM

## 2023-09-03 DIAGNOSIS — R1013 Epigastric pain: Secondary | ICD-10-CM | POA: Diagnosis not present

## 2023-09-03 NOTE — Progress Notes (Signed)
[name] 67 y.o. 1956/06/18 [id number]  Assessment & Plan:   Encounter Diagnoses  Name Primary?   Abdominal pain, epigastric Yes   Liver lesion    Nausea without vomiting       New onset of mid-epigastric pain radiating to the back, predominantly postprandial. Initial suspicion of gallbladder disease, however, ultrasound was negative for gallstones. Pending CT scan. -Schedule upper endoscopy to further evaluate. -Consider HIDA scan for possible gallbladder dysfunction.     H. pylori breath test negative twice this year.  Liver lesion is small and was suspected to be a hemangioma so it might not show up on the CT scan.  Based upon the size I think MRI would need to be confirm what it was.  I do not think it is related to her pain but will bear that in mind.  CT did not reveal a cause for her pain. Subjective:   Chief Complaint: Epigastric pain, nausea  HPI Discussed the use of AI scribe software for clinical note transcription with the patient, who gave verbal consent to proceed.    The patient, with a known family history of gallbladder disease, presented with symptoms she thought were suggestive of a gallbladder attack approximately a month ago. She experienced severe pain radiating to the back, which was intermittent over a weekend. Accompanying this pain was a general feeling of malaise. Laboratory investigations at the time revealed an elevated white blood cell count and a slightly increased liver enzyme (AST). However, an ultrasound of the gallbladder did not reveal any abnormalities.  Since this episode, the patient has been experiencing persistent discomfort. She describes a constant feeling of queasiness and a pain that originates in the mid-chest region and radiates to the back. This pain is particularly noticeable after eating and can be severe enough to take her breath away. The patient has not experienced symptoms like this before, although she recalls a  brief episode of mid-gastric pain in the previous year that resolved spontaneously.  The patient's platelet count was noted to be slightly low, but this is a known familial trait and not a new finding. The patient has not experienced any unintentional weight loss. She has been prescribed pantoprazole, but she reports that it did not provide any relief and she has stopped taking it. The patient's symptoms do not seem to interfere with her sleep, provided she eats early enough in the evening. She has not tried any over-the-counter remedies for her symptoms.      Right upper quadrant ultrasound 08/21/2023 IMPRESSION: 1. No cholelithiasis or sonographic evidence for acute cholecystitis. 2. There is a 1.2 cm echogenic mass in the liver. This is nonspecific but may represent a hemangioma. Recommend follow-up ultrasound in 6 months to assess for stability.     Lab Results  Component Value Date   WBC 6.6 08/21/2023   HGB 13.9 08/21/2023   HCT 41.7 08/21/2023   MCV 87.4 08/21/2023   PLT 115.0 Repeated and verified X2. (L) 08/21/2023   Lab Results  Component Value Date   ALT 22 08/12/2023   AST 72 (H) 08/12/2023   ALKPHOS 58 08/12/2023   BILITOT 1.0 08/12/2023   Lab Results  Component Value Date   LIPASE 33.0 08/12/2023   Lab Results  Component Value Date   AMYLASE 56 08/12/2023    H. pylori breath test negative in February and 08/21/2023  Colonoscopy and colon polyp history: 03/2012 - 15 mm sessile serrated adenoma - anticipate repeat colonoscopy approx 03/2015 Leone Payor)  06/17/2015 4 polyps removed 2 subcentimeter adenomas and 2 distal hyperplastic polyps  12/01/2020 4 diminutive polyps - 3 recovered 1 adenoma 1 ssp 1 benign mucosa recall 2027   Wt Readings from Last 3 Encounters:  09/03/23 165 lb 6 oz (75 kg)  08/12/23 162 lb 6.4 oz (73.7 kg)  04/09/23 166 lb (75.3 kg)     No Known Allergies Current Meds  Medication Sig   pantoprazole (PROTONIX) 40 MG tablet Take 1 tablet  (40 mg total) by mouth daily.   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   Review of Systems As per HPI  Objective:   Physical Exam BP 120/80 (BP Location: Left Arm, Patient Position: Sitting, Cuff Size: Normal)   Pulse 68   Ht 5\' 6"  (1.676 m)   Wt 165 lb 6 oz (75 kg)   BMI 26.69 kg/m  Physical Exam   CHEST: Lungs clear to auscultation. CARDIOVASCULAR: Heart sounds normal. ABDOMEN: Non-tender on palpation, except for tenderness in the right upper quadrant. Back is non-tender.

## 2023-09-03 NOTE — Patient Instructions (Signed)
  You have been scheduled for an endoscopy. Please follow written instructions given to you at your visit today.  If you use inhalers (even only as needed), please bring them with you on the day of your procedure.  If you take any of the following medications, they will need to be adjusted prior to your procedure:   DO NOT TAKE 7 DAYS PRIOR TO TEST- Trulicity (dulaglutide) Ozempic, Wegovy (semaglutide) Mounjaro (tirzepatide) Bydureon Bcise (exanatide extended release)  DO NOT TAKE 1 DAY PRIOR TO YOUR TEST Rybelsus (semaglutide) Adlyxin (lixisenatide) Victoza (liraglutide) Byetta (exanatide) ___________________________________________________________________________    I appreciate the opportunity to care for you. Stan Head, MD, [facility]

## 2023-09-04 ENCOUNTER — Encounter (HOSPITAL_BASED_OUTPATIENT_CLINIC_OR_DEPARTMENT_OTHER): Payer: Self-pay

## 2023-09-04 ENCOUNTER — Encounter: Payer: Self-pay | Admitting: Internal Medicine

## 2023-09-04 ENCOUNTER — Ambulatory Visit (HOSPITAL_BASED_OUTPATIENT_CLINIC_OR_DEPARTMENT_OTHER)
Admission: RE | Admit: 2023-09-04 | Discharge: 2023-09-04 | Disposition: A | Discharge status: Home / Self Care | Payer: Medicare HMO | Source: Ambulatory Visit | Attending: Family Medicine | Admitting: Family Medicine

## 2023-09-04 DIAGNOSIS — M5451 Vertebrogenic low back pain: Secondary | ICD-10-CM | POA: Diagnosis not present

## 2023-09-04 DIAGNOSIS — R160 Hepatomegaly, not elsewhere classified: Secondary | ICD-10-CM | POA: Insufficient documentation

## 2023-09-04 DIAGNOSIS — M9903 Segmental and somatic dysfunction of lumbar region: Secondary | ICD-10-CM | POA: Diagnosis not present

## 2023-09-04 DIAGNOSIS — M62830 Muscle spasm of back: Secondary | ICD-10-CM | POA: Diagnosis not present

## 2023-09-04 DIAGNOSIS — R109 Unspecified abdominal pain: Secondary | ICD-10-CM | POA: Diagnosis not present

## 2023-09-04 DIAGNOSIS — K769 Liver disease, unspecified: Secondary | ICD-10-CM | POA: Diagnosis not present

## 2023-09-04 DIAGNOSIS — R932 Abnormal findings on diagnostic imaging of liver and biliary tract: Secondary | ICD-10-CM | POA: Diagnosis not present

## 2023-09-04 DIAGNOSIS — M4126 Other idiopathic scoliosis, lumbar region: Secondary | ICD-10-CM | POA: Diagnosis not present

## 2023-09-04 MED ORDER — IOHEXOL 300 MG/ML  SOLN
100.0000 mL | Freq: Once | INTRAMUSCULAR | Status: AC | PRN
  Administered 2023-09-04: 100 mL via INTRAVENOUS

## 2023-09-13 ENCOUNTER — Ambulatory Visit (AMBULATORY_SURGERY_CENTER): Payer: Medicare HMO | Admitting: Internal Medicine

## 2023-09-13 ENCOUNTER — Encounter: Payer: Self-pay | Admitting: Internal Medicine

## 2023-09-13 ENCOUNTER — Other Ambulatory Visit (INDEPENDENT_AMBULATORY_CARE_PROVIDER_SITE_OTHER): Payer: Medicare HMO

## 2023-09-13 VITALS — BP 142/62 | HR 65 | Temp 97.3°F | Resp 14 | Ht 66.0 in | Wt 165.0 lb

## 2023-09-13 DIAGNOSIS — R1013 Epigastric pain: Secondary | ICD-10-CM

## 2023-09-13 LAB — HEPATIC FUNCTION PANEL
ALT: 12 U/L (ref 0–35)
AST: 18 U/L (ref 0–37)
Albumin: 3.9 g/dL (ref 3.5–5.2)
Alkaline Phosphatase: 65 U/L (ref 39–117)
Bilirubin, Direct: 0.1 mg/dL (ref 0.0–0.3)
Total Bilirubin: 0.7 mg/dL (ref 0.2–1.2)
Total Protein: 6.8 g/dL (ref 6.0–8.3)

## 2023-09-13 MED ORDER — SODIUM CHLORIDE 0.9 % IV SOLN: 500.0000 mL | Freq: Once | INTRAVENOUS | Status: DC

## 2023-09-13 NOTE — Progress Notes (Signed)
Vitals-CW  Pt's states no medical or surgical changes since previsit or office visit. 

## 2023-09-13 NOTE — Progress Notes (Signed)
Vss nad trans to pacu 

## 2023-09-13 NOTE — Progress Notes (Signed)
History and Physical Interval Note:  09/13/2023 10:01 AM  Sara Martinez  has presented today for endoscopic procedure(s), with the diagnosis of  Encounter Diagnosis  Name Primary?   Abdominal pain, epigastric Yes  .  The various methods of evaluation and treatment have been discussed with the patient and/or family. After consideration of risks, benefits and other options for treatment, the patient has consented to  the endoscopic procedure(s).   The patient's history has been reviewed, patient examined, no change in status, stable for endoscopic procedure(s).  I have reviewed the patient's chart and labs.  Questions were answered to the patient's satisfaction.     Iva Boop, MD, [facility]

## 2023-09-13 NOTE — Patient Instructions (Addendum)
This exam looked normal.  Cannot say what cause of pain is or was based upon what we know.  I am repeating liver chemistries since one was abnormal. Then will discuss next steps - I think HIDA scan vs MRI are next imaging test choices.  Stay on the pantoprazole for now.  I appreciate the opportunity to care for you. Iva Boop, MD, FACG  YOU HAD AN ENDOSCOPIC PROCEDURE TODAY AT THE [facility]:   Refer to the procedure report that was given to you for any specific questions about what was found during the examination.  If the procedure report does not answer your questions, please call your gastroenterologist to clarify.  If you requested that your care partner not be given the details of your procedure findings, then the procedure report has been included in a sealed envelope for you to review at your convenience later.  YOU SHOULD EXPECT: Some feelings of bloating in the abdomen. Passage of more gas than usual.  Walking can help get rid of the air that was put into your GI tract during the procedure and reduce the bloating. If you had a lower endoscopy (such as a colonoscopy or flexible sigmoidoscopy) you may notice spotting of blood in your stool or on the toilet paper. If you underwent a bowel prep for your procedure, you may not have a normal bowel movement for a few days.  Please Note:  You might notice some irritation and congestion in your nose or some drainage.  This is from the oxygen used during your procedure.  There is no need for concern and it should clear up in a day or so.  SYMPTOMS TO REPORT IMMEDIATELY:  Following upper endoscopy (EGD)  Vomiting of blood or coffee ground material  New chest pain or pain under the shoulder blades  Painful or persistently difficult swallowing  New shortness of breath  Fever of 100F or higher  Black, tarry-looking stools  For urgent or emergent issues, a gastroenterologist can be reached at any hour by calling [phone]  [phone]. Do not use MyChart messaging for urgent concerns.    DIET:  We do recommend a small meal at first, but then you may proceed to your regular diet.  Drink plenty of fluids but you should avoid alcoholic beverages for 24 hours.  ACTIVITY:  You should plan to take it easy for the rest of today and you should NOT DRIVE or use heavy machinery until tomorrow (because of the sedation medicines used during the test).    FOLLOW UP: Our staff will call the number listed on your records the next business day following your procedure.  We will call around 7:15- 8:00 am to check on you and address any questions or concerns that you may have regarding the information given to you following your procedure. If we do not reach you, we will leave a message.     If any biopsies were taken you will be contacted by phone or by letter within the next 1-3 weeks.  Please call us at [phone] if you have not heard about the biopsies in 3 weeks.    SIGNATURES/CONFIDENTIALITY: You and/or your care partner have signed paperwork which will be entered into your electronic medical record.  These signatures attest to the fact that that the information above on your After Visit Summary has been reviewed and is understood.  Full responsibility of the confidentiality of this discharge information lies with you and/or your care-partner.

## 2023-09-13 NOTE — Op Note (Addendum)
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                            Elevated transaminase Medicines:                Monitored Anesthesia Care Procedure:                Pre-Anesthesia Assessment:                           - Prior to the procedure, a History and Physical                            was performed, and patient medications and                            allergies were reviewed. The patient's tolerance of                            previous anesthesia was also reviewed. The risks                            and benefits of the procedure and the sedation                            options and risks were discussed with the patient.                            All questions were answered, and informed consent                            was obtained. Prior Anticoagulants: The patient has                            taken no anticoagulant or antiplatelet agents. ASA                            Grade Assessment: II - A patient with mild systemic  disease. After reviewing the risks and benefits,                            the patient was deemed in satisfactory condition to                            undergo the procedure.                           After obtaining informed consent, the endoscope was                            passed under direct vision. Throughout the                            procedure, the patient's blood  pressure, pulse, and                            oxygen saturations were monitored continuously. The                            Olympus Scope [id number] was introduced through the                            mouth, and advanced to the second part of duodenum.                            The upper GI endoscopy was accomplished without                            difficulty. The patient tolerated the procedure                            well. Scope In: Scope Out: Findings:                 The esophagus was normal.                           The stomach was normal.                           The examined duodenum was normal.                           The cardia and gastric fundus were normal on                            retroflexion. Complications:            No immediate complications. Estimated Blood Loss:     Estimated blood loss: none. Impression:               - Normal esophagus.                           - Normal stomach.                           -  Normal examined duodenum.                           - No specimens collected. Recommendation:           - Patient has a contact number available for                            emergencies. The signs and symptoms of potential                            delayed complications were discussed with the                            patient. Return to normal activities tomorrow.                            Written discharge instructions were provided to the                            patient.                           - Resume previous diet.                           - Continue present medications.                           - Recheck LFT's today - had elevated transaminase                            before                           Depending upon those results and clinical course                            could need HIDA or MRI (has 1.2 cm lesion on [place],                            1.1 cm on CT - probably a hemangioma) - I will                             discuss w/ her after LFT's resulted                           stay on pantprazole for now Iva Boop, MD 09/13/2023 10:21:46 AM This report has been signed electronically.

## 2023-09-16 ENCOUNTER — Telehealth: Payer: Self-pay

## 2023-09-16 NOTE — Telephone Encounter (Signed)
Left message on follow up call. 

## 2023-09-17 DIAGNOSIS — M5451 Vertebrogenic low back pain: Secondary | ICD-10-CM | POA: Diagnosis not present

## 2023-09-17 DIAGNOSIS — M62830 Muscle spasm of back: Secondary | ICD-10-CM | POA: Diagnosis not present

## 2023-09-17 DIAGNOSIS — M9903 Segmental and somatic dysfunction of lumbar region: Secondary | ICD-10-CM | POA: Diagnosis not present

## 2023-09-17 DIAGNOSIS — M4126 Other idiopathic scoliosis, lumbar region: Secondary | ICD-10-CM | POA: Diagnosis not present

## 2023-09-26 DIAGNOSIS — M9903 Segmental and somatic dysfunction of lumbar region: Secondary | ICD-10-CM | POA: Diagnosis not present

## 2023-09-26 DIAGNOSIS — M4126 Other idiopathic scoliosis, lumbar region: Secondary | ICD-10-CM | POA: Diagnosis not present

## 2023-09-26 DIAGNOSIS — M62830 Muscle spasm of back: Secondary | ICD-10-CM | POA: Diagnosis not present

## 2023-09-26 DIAGNOSIS — M5451 Vertebrogenic low back pain: Secondary | ICD-10-CM | POA: Diagnosis not present

## 2023-10-02 DIAGNOSIS — M9903 Segmental and somatic dysfunction of lumbar region: Secondary | ICD-10-CM | POA: Diagnosis not present

## 2023-10-02 DIAGNOSIS — M62830 Muscle spasm of back: Secondary | ICD-10-CM | POA: Diagnosis not present

## 2023-10-02 DIAGNOSIS — M5451 Vertebrogenic low back pain: Secondary | ICD-10-CM | POA: Diagnosis not present

## 2023-10-02 DIAGNOSIS — M4126 Other idiopathic scoliosis, lumbar region: Secondary | ICD-10-CM | POA: Diagnosis not present

## 2023-10-09 DIAGNOSIS — M4126 Other idiopathic scoliosis, lumbar region: Secondary | ICD-10-CM | POA: Diagnosis not present

## 2023-10-09 DIAGNOSIS — M9903 Segmental and somatic dysfunction of lumbar region: Secondary | ICD-10-CM | POA: Diagnosis not present

## 2023-10-09 DIAGNOSIS — M62830 Muscle spasm of back: Secondary | ICD-10-CM | POA: Diagnosis not present

## 2023-10-09 DIAGNOSIS — M5451 Vertebrogenic low back pain: Secondary | ICD-10-CM | POA: Diagnosis not present

## 2024-04-21 DIAGNOSIS — M9901 Segmental and somatic dysfunction of cervical region: Secondary | ICD-10-CM | POA: Diagnosis not present

## 2024-04-21 DIAGNOSIS — M9905 Segmental and somatic dysfunction of pelvic region: Secondary | ICD-10-CM | POA: Diagnosis not present

## 2024-04-21 DIAGNOSIS — M9903 Segmental and somatic dysfunction of lumbar region: Secondary | ICD-10-CM | POA: Diagnosis not present

## 2024-04-21 DIAGNOSIS — M9902 Segmental and somatic dysfunction of thoracic region: Secondary | ICD-10-CM | POA: Diagnosis not present

## 2024-04-23 DIAGNOSIS — M9905 Segmental and somatic dysfunction of pelvic region: Secondary | ICD-10-CM | POA: Diagnosis not present

## 2024-04-23 DIAGNOSIS — M9901 Segmental and somatic dysfunction of cervical region: Secondary | ICD-10-CM | POA: Diagnosis not present

## 2024-04-23 DIAGNOSIS — M9903 Segmental and somatic dysfunction of lumbar region: Secondary | ICD-10-CM | POA: Diagnosis not present

## 2024-04-23 DIAGNOSIS — M9902 Segmental and somatic dysfunction of thoracic region: Secondary | ICD-10-CM | POA: Diagnosis not present

## 2024-04-27 DIAGNOSIS — M9905 Segmental and somatic dysfunction of pelvic region: Secondary | ICD-10-CM | POA: Diagnosis not present

## 2024-04-27 DIAGNOSIS — M9902 Segmental and somatic dysfunction of thoracic region: Secondary | ICD-10-CM | POA: Diagnosis not present

## 2024-04-27 DIAGNOSIS — M9903 Segmental and somatic dysfunction of lumbar region: Secondary | ICD-10-CM | POA: Diagnosis not present

## 2024-04-27 DIAGNOSIS — M9901 Segmental and somatic dysfunction of cervical region: Secondary | ICD-10-CM | POA: Diagnosis not present

## 2024-05-12 DIAGNOSIS — M9902 Segmental and somatic dysfunction of thoracic region: Secondary | ICD-10-CM | POA: Diagnosis not present

## 2024-05-12 DIAGNOSIS — M9901 Segmental and somatic dysfunction of cervical region: Secondary | ICD-10-CM | POA: Diagnosis not present

## 2024-05-12 DIAGNOSIS — M9905 Segmental and somatic dysfunction of pelvic region: Secondary | ICD-10-CM | POA: Diagnosis not present

## 2024-05-12 DIAGNOSIS — M9903 Segmental and somatic dysfunction of lumbar region: Secondary | ICD-10-CM | POA: Diagnosis not present

## 2024-06-09 DIAGNOSIS — M9903 Segmental and somatic dysfunction of lumbar region: Secondary | ICD-10-CM | POA: Diagnosis not present

## 2024-06-09 DIAGNOSIS — M9902 Segmental and somatic dysfunction of thoracic region: Secondary | ICD-10-CM | POA: Diagnosis not present

## 2024-06-09 DIAGNOSIS — M9901 Segmental and somatic dysfunction of cervical region: Secondary | ICD-10-CM | POA: Diagnosis not present

## 2024-06-09 DIAGNOSIS — M9905 Segmental and somatic dysfunction of pelvic region: Secondary | ICD-10-CM | POA: Diagnosis not present

## 2024-07-09 DIAGNOSIS — M9905 Segmental and somatic dysfunction of pelvic region: Secondary | ICD-10-CM | POA: Diagnosis not present

## 2024-07-09 DIAGNOSIS — M5136 Other intervertebral disc degeneration, lumbar region with discogenic back pain only: Secondary | ICD-10-CM | POA: Diagnosis not present

## 2024-07-09 DIAGNOSIS — M25552 Pain in left hip: Secondary | ICD-10-CM | POA: Diagnosis not present

## 2024-07-09 DIAGNOSIS — M9903 Segmental and somatic dysfunction of lumbar region: Secondary | ICD-10-CM | POA: Diagnosis not present

## 2024-07-09 DIAGNOSIS — M50322 Other cervical disc degeneration at C5-C6 level: Secondary | ICD-10-CM | POA: Diagnosis not present

## 2024-07-09 DIAGNOSIS — M9906 Segmental and somatic dysfunction of lower extremity: Secondary | ICD-10-CM | POA: Diagnosis not present

## 2024-07-09 DIAGNOSIS — M9901 Segmental and somatic dysfunction of cervical region: Secondary | ICD-10-CM | POA: Diagnosis not present

## 2024-08-06 DIAGNOSIS — M9901 Segmental and somatic dysfunction of cervical region: Secondary | ICD-10-CM | POA: Diagnosis not present

## 2024-08-06 DIAGNOSIS — M25552 Pain in left hip: Secondary | ICD-10-CM | POA: Diagnosis not present

## 2024-08-06 DIAGNOSIS — M50322 Other cervical disc degeneration at C5-C6 level: Secondary | ICD-10-CM | POA: Diagnosis not present

## 2024-08-06 DIAGNOSIS — M9906 Segmental and somatic dysfunction of lower extremity: Secondary | ICD-10-CM | POA: Diagnosis not present

## 2024-08-06 DIAGNOSIS — M9905 Segmental and somatic dysfunction of pelvic region: Secondary | ICD-10-CM | POA: Diagnosis not present

## 2024-08-06 DIAGNOSIS — M5136 Other intervertebral disc degeneration, lumbar region with discogenic back pain only: Secondary | ICD-10-CM | POA: Diagnosis not present

## 2024-08-06 DIAGNOSIS — M9903 Segmental and somatic dysfunction of lumbar region: Secondary | ICD-10-CM | POA: Diagnosis not present
